# Patient Record
Sex: Female | Born: 1955
Health system: Southern US, Community
[De-identification: ages and names within clinical notes are randomized; demographics above are authoritative.]

## PROBLEM LIST (undated history)

## (undated) DIAGNOSIS — M797 Fibromyalgia: Secondary | ICD-10-CM

## (undated) DIAGNOSIS — E669 Obesity, unspecified: Secondary | ICD-10-CM

## (undated) DIAGNOSIS — I10 Essential (primary) hypertension: Secondary | ICD-10-CM

## (undated) DIAGNOSIS — F419 Anxiety disorder, unspecified: Secondary | ICD-10-CM

## (undated) DIAGNOSIS — G4730 Sleep apnea, unspecified: Secondary | ICD-10-CM

## (undated) DIAGNOSIS — E119 Type 2 diabetes mellitus without complications: Secondary | ICD-10-CM

## (undated) DIAGNOSIS — E785 Hyperlipidemia, unspecified: Secondary | ICD-10-CM

## (undated) DIAGNOSIS — F32A Depression, unspecified: Secondary | ICD-10-CM

## (undated) HISTORY — DX: Sleep apnea, unspecified: G47.30

## (undated) HISTORY — PX: OTHER SURGICAL HISTORY: SHX169

## (undated) HISTORY — DX: Type 2 diabetes mellitus without complications: E11.9

## (undated) HISTORY — DX: Hyperlipidemia, unspecified: E78.5

## (undated) HISTORY — DX: Obesity, unspecified: E66.9

## (undated) HISTORY — PX: HERNIA REPAIR: SHX51

## (undated) HISTORY — PX: CHOLECYSTECTOMY: SHX55

---
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
## 1999-04-29 ENCOUNTER — Other Ambulatory Visit: Admission: RE | Admit: 1999-04-29 | Discharge: 1999-04-29 | Payer: Self-pay | Admitting: *Deleted

## 1999-10-28 ENCOUNTER — Encounter: Admission: RE | Admit: 1999-10-28 | Discharge: 1999-10-28 | Payer: Self-pay | Admitting: Family Medicine

## 1999-10-28 ENCOUNTER — Encounter: Payer: Self-pay | Admitting: Family Medicine

## 2000-11-09 ENCOUNTER — Encounter: Admission: RE | Admit: 2000-11-09 | Discharge: 2000-11-09 | Payer: Self-pay | Admitting: Family Medicine

## 2000-11-09 ENCOUNTER — Encounter: Payer: Self-pay | Admitting: Family Medicine

## 2002-01-17 ENCOUNTER — Encounter: Payer: Self-pay | Admitting: Family Medicine

## 2002-01-17 ENCOUNTER — Encounter: Admission: RE | Admit: 2002-01-17 | Discharge: 2002-01-17 | Payer: Self-pay | Admitting: Family Medicine

## 2002-02-20 ENCOUNTER — Observation Stay (HOSPITAL_COMMUNITY): Admission: RE | Admit: 2002-02-20 | Discharge: 2002-02-21 | Payer: Self-pay | Admitting: Surgery

## 2002-02-20 ENCOUNTER — Encounter (INDEPENDENT_AMBULATORY_CARE_PROVIDER_SITE_OTHER): Payer: Self-pay | Admitting: Specialist

## 2002-06-06 ENCOUNTER — Other Ambulatory Visit: Admission: RE | Admit: 2002-06-06 | Discharge: 2002-06-06 | Payer: Self-pay | Admitting: Family Medicine

## 2003-02-13 ENCOUNTER — Encounter: Admission: RE | Admit: 2003-02-13 | Discharge: 2003-02-13 | Payer: Self-pay | Admitting: *Deleted

## 2003-02-13 ENCOUNTER — Encounter: Payer: Self-pay | Admitting: *Deleted

## 2003-06-26 ENCOUNTER — Other Ambulatory Visit: Admission: RE | Admit: 2003-06-26 | Discharge: 2003-06-26 | Payer: Self-pay | Admitting: *Deleted

## 2003-08-07 ENCOUNTER — Encounter: Admission: RE | Admit: 2003-08-07 | Discharge: 2003-08-07 | Payer: Self-pay | Admitting: *Deleted

## 2003-08-07 ENCOUNTER — Encounter: Payer: Self-pay | Admitting: *Deleted

## 2003-10-15 ENCOUNTER — Ambulatory Visit (HOSPITAL_BASED_OUTPATIENT_CLINIC_OR_DEPARTMENT_OTHER): Admission: RE | Admit: 2003-10-15 | Discharge: 2003-10-15 | Payer: Self-pay | Admitting: Surgery

## 2003-10-15 ENCOUNTER — Encounter (INDEPENDENT_AMBULATORY_CARE_PROVIDER_SITE_OTHER): Payer: Self-pay | Admitting: *Deleted

## 2003-10-15 ENCOUNTER — Ambulatory Visit (HOSPITAL_COMMUNITY): Admission: RE | Admit: 2003-10-15 | Discharge: 2003-10-15 | Payer: Self-pay | Admitting: Surgery

## 2004-02-12 ENCOUNTER — Encounter: Admission: RE | Admit: 2004-02-12 | Discharge: 2004-02-12 | Payer: Self-pay | Admitting: Family Medicine

## 2004-07-01 ENCOUNTER — Other Ambulatory Visit: Admission: RE | Admit: 2004-07-01 | Discharge: 2004-07-01 | Payer: Self-pay | Admitting: Family Medicine

## 2005-02-17 ENCOUNTER — Encounter: Admission: RE | Admit: 2005-02-17 | Discharge: 2005-02-17 | Payer: Self-pay | Admitting: Family Medicine

## 2005-10-23 ENCOUNTER — Other Ambulatory Visit: Admission: RE | Admit: 2005-10-23 | Discharge: 2005-10-23 | Payer: Self-pay | Admitting: Family Medicine

## 2006-03-23 ENCOUNTER — Encounter: Admission: RE | Admit: 2006-03-23 | Discharge: 2006-03-23 | Payer: Self-pay | Admitting: Family Medicine

## 2006-06-14 ENCOUNTER — Encounter: Admission: RE | Admit: 2006-06-14 | Discharge: 2006-06-14 | Payer: Self-pay | Admitting: Family Medicine

## 2006-10-03 ENCOUNTER — Emergency Department (HOSPITAL_COMMUNITY): Admission: EM | Admit: 2006-10-03 | Discharge: 2006-10-03 | Payer: Self-pay | Admitting: Emergency Medicine

## 2006-12-13 ENCOUNTER — Other Ambulatory Visit: Admission: RE | Admit: 2006-12-13 | Discharge: 2006-12-13 | Payer: Self-pay | Admitting: Family Medicine

## 2007-01-11 ENCOUNTER — Ambulatory Visit: Payer: Self-pay | Admitting: Gastroenterology

## 2007-01-25 ENCOUNTER — Encounter (INDEPENDENT_AMBULATORY_CARE_PROVIDER_SITE_OTHER): Payer: Self-pay | Admitting: Specialist

## 2007-01-25 ENCOUNTER — Ambulatory Visit: Payer: Self-pay | Admitting: Gastroenterology

## 2007-04-05 ENCOUNTER — Encounter: Admission: RE | Admit: 2007-04-05 | Discharge: 2007-04-05 | Payer: Self-pay | Admitting: Family Medicine

## 2007-12-05 ENCOUNTER — Emergency Department: Payer: Self-pay | Admitting: Emergency Medicine

## 2008-02-07 ENCOUNTER — Other Ambulatory Visit: Admission: RE | Admit: 2008-02-07 | Discharge: 2008-02-07 | Payer: Self-pay | Admitting: Family Medicine

## 2008-09-17 ENCOUNTER — Encounter: Admission: RE | Admit: 2008-09-17 | Discharge: 2008-09-17 | Payer: Self-pay | Admitting: Internal Medicine

## 2008-10-02 ENCOUNTER — Encounter: Admission: RE | Admit: 2008-10-02 | Discharge: 2008-10-02 | Payer: Self-pay | Admitting: Family Medicine

## 2009-02-05 ENCOUNTER — Other Ambulatory Visit: Admission: RE | Admit: 2009-02-05 | Discharge: 2009-02-05 | Payer: Self-pay | Admitting: Family Medicine

## 2009-05-06 ENCOUNTER — Encounter: Admission: RE | Admit: 2009-05-06 | Discharge: 2009-05-06 | Payer: Self-pay | Admitting: Internal Medicine

## 2009-10-08 ENCOUNTER — Encounter: Admission: RE | Admit: 2009-10-08 | Discharge: 2009-10-08 | Payer: Self-pay | Admitting: Family Medicine

## 2010-10-10 ENCOUNTER — Encounter: Admission: RE | Admit: 2010-10-10 | Discharge: 2010-10-10 | Payer: Self-pay | Admitting: Family Medicine

## 2011-04-28 NOTE — Op Note (Signed)
   Bonnie Ramos, Bonnie Ramos                          ACCOUNT NO.:  [id number]   MEDICAL RECORD NO.:  [id number]                   PATIENT TYPE:  AMB   LOCATION:  DSC                                  FACILITY:  [facility]   PHYSICIAN:  Currie Paris, M.D.           DATE OF BIRTH:  [birth year]   DATE OF PROCEDURE:  10/15/2003  DATE OF DISCHARGE:                                 OPERATIVE REPORT   PREOPERATIVE DIAGNOSIS:  Chronically inflamed epidermoid cyst, left axilla.   POSTOPERATIVE DIAGNOSIS:  Chronically inflamed epidermoid cyst, left axilla.   PROCEDURE:  Excision of epidermoid cyst, left axilla.   SURGEON:  Currie Paris, M.D.   ANESTHESIA:  Local.   CLINICAL HISTORY:  This patient for many years has had an epidermoid cyst  which intermittently reforms and drains. It was currently quiescent and we  scheduled her for an excision.   DESCRIPTION OF PROCEDURE:  The patient was seen in the minor procedure area  and had no further questions.  The area was marked and identified by both  myself and the patient.  The skin and the area around it were anesthetized  with about 8 mL of 1% Xylocaine with epinephrine plus 1 mL of sodium  bicarbonate mixed in.  This gave good anesthesia.  The area was prepped with  some Betadine.  An elliptical incision was made to include the punctum and  the small subcutaneous mass that was seen to be attached to the dermis.  The  incision was then closed with some 4-0 Monocryl subcuticular and Dermabond.  The patient tolerated the procedure well with no problems.                                               Currie Paris, M.D.    CJS/MEDQ  D:  10/15/2003  T:  10/15/2003  Job:  [id number]

## 2011-04-28 NOTE — Op Note (Signed)
[facility]  Patient:    Bonnie Ramos, Bonnie Ramos Visit Number: [id number] MRN: [id number]          Service Type: SUR Location: 3W 0366 01 Attending Physician:  Charlton Haws Dictated by:   Currie Paris, M.D. Proc. Date: 02/20/02 Admit Date:  02/20/2002   CC:         Meredith Staggers, M.D.  Charolett Bumpers III, M.D.   Operative Report  VISIT NUMBER:  [id number].  OFFICE MEDICAL RECORD NUMBER:  [id number].  PREOPERATIVE DIAGNOSIS:  Chronic calculus cholecystitis.  POSTOPERATIVE DIAGNOSIS:  Chronic calculus cholecystitis.  OPERATION: Laparoscopic cholecystectomy.  SURGEON:  Currie Paris, M.D.  ASSISTANT:  Adolph Pollack, M.D.  ANESTHESIA:  General endotracheal.  CLINICAL HISTORY:  This patient is a 55 year old woman with biliary-type symptoms and multiple stones seen on gallbladder ultrasound. After discussion of the procedure, she elected to proceed to laparoscopic cholecystectomy.  DESCRIPTION OF PROCEDURE:  The patient was brought to the operating room and after satisfactory general endotracheal anesthesia had been attained, the abdomen was prepped and draped. I used a vertical incision in the umbilicus because she had had a previous transverse on the right there and was able to identify the fascia, open it, and enter the peritoneal cavity under direct vision. Pursestring was placed and the Hasson introduced. The abdomen was insufflated to 15 and the camera introduced.  The gallbladder was thin walled but very chronically inflamed with instead of a robins egg blue more of a whitish discoloration to it. No other gross abnormalities were seen in the peritoneal cavity. There are some omental adhesions inferiorly and around the umbilicus.  The patient was placed in reverse Trendelenburg and tilted to the left. A 10 mm trocar was placed in the epigastric and two 5 mm laterally all under direct vision. The gallbladder  is retracted and dissection begun around the cystic duct and I could identify the cystic duct and the cystic artery and freed up the peritoneum on either side of the gallbladder so that I had a large window in the triangle of Calot, could see this area clearly. Once this was all identified, I put three clips on the stay side of the cystic duct and one on the gallbladder side and divided it. The cystic artery was divided in a similar fashion with two clips on the stay side and then a small _______ clipped one time; this was just adjacent to the artery.  The gallbladder was then removed from below to above with coagulation current _______ cautery. Just prior to disconnecting it from the gallbladder bed, we irrigated to make sure everything was dry and then disconnected it and brought it out through the umbilical port. The abdomen was reinsufflated and a final irrigation taken and everything again appeared dry. The lateral ports were removed and they did not bleed. The umbilical port was closed through the previously placed pursestring. The abdomen was deflated through the epigastric port. The skin was closed with 2-0 Monocryl subcuticular and Steri-Strips. the patient tolerated the procedure well and there no operative complications and all counts were correct Dictated by:   Currie Paris, M.D. Attending Physician:  Charlton Haws DD:  02/20/02 TD:  02/20/02 Job: [id number]

## 2011-06-23 ENCOUNTER — Ambulatory Visit: Payer: 59 | Attending: Family Medicine | Admitting: *Deleted

## 2011-06-23 DIAGNOSIS — IMO0001 Reserved for inherently not codable concepts without codable children: Secondary | ICD-10-CM | POA: Insufficient documentation

## 2011-06-23 DIAGNOSIS — R42 Dizziness and giddiness: Secondary | ICD-10-CM | POA: Insufficient documentation

## 2011-07-14 ENCOUNTER — Encounter: Payer: 59 | Admitting: Physical Therapy

## 2011-07-28 ENCOUNTER — Ambulatory Visit: Payer: 59 | Admitting: Physical Therapy

## 2011-09-04 ENCOUNTER — Other Ambulatory Visit: Payer: Self-pay | Admitting: Family Medicine

## 2011-09-04 DIAGNOSIS — Z1231 Encounter for screening mammogram for malignant neoplasm of breast: Secondary | ICD-10-CM

## 2011-10-16 ENCOUNTER — Ambulatory Visit
Admission: RE | Admit: 2011-10-16 | Discharge: 2011-10-16 | Disposition: A | Discharge status: Home / Self Care | Payer: 59 | Source: Ambulatory Visit | Attending: Family Medicine | Admitting: Family Medicine

## 2011-10-16 DIAGNOSIS — Z1231 Encounter for screening mammogram for malignant neoplasm of breast: Secondary | ICD-10-CM

## 2012-09-18 ENCOUNTER — Other Ambulatory Visit: Payer: Self-pay | Admitting: Family Medicine

## 2012-09-18 DIAGNOSIS — Z1231 Encounter for screening mammogram for malignant neoplasm of breast: Secondary | ICD-10-CM

## 2012-10-18 ENCOUNTER — Ambulatory Visit: Payer: 59

## 2013-01-10 ENCOUNTER — Other Ambulatory Visit: Payer: Self-pay | Admitting: Family Medicine

## 2013-01-10 ENCOUNTER — Other Ambulatory Visit (HOSPITAL_COMMUNITY)
Admission: RE | Admit: 2013-01-10 | Discharge: 2013-01-10 | Disposition: A | Discharge status: Home / Self Care | Payer: BC Managed Care – PPO | Source: Ambulatory Visit | Attending: Family Medicine | Admitting: Family Medicine

## 2013-01-10 DIAGNOSIS — Z0000 Encounter for general adult medical examination without abnormal findings: Secondary | ICD-10-CM | POA: Insufficient documentation

## 2013-01-17 ENCOUNTER — Ambulatory Visit
Admission: RE | Admit: 2013-01-17 | Discharge: 2013-01-17 | Disposition: A | Discharge status: Home / Self Care | Payer: BC Managed Care – PPO | Source: Ambulatory Visit | Attending: Family Medicine | Admitting: Family Medicine

## 2013-01-17 DIAGNOSIS — Z1231 Encounter for screening mammogram for malignant neoplasm of breast: Secondary | ICD-10-CM

## 2013-12-18 ENCOUNTER — Other Ambulatory Visit: Payer: Self-pay

## 2013-12-18 DIAGNOSIS — Z1231 Encounter for screening mammogram for malignant neoplasm of breast: Secondary | ICD-10-CM

## 2014-01-19 ENCOUNTER — Ambulatory Visit
Admission: RE | Admit: 2014-01-19 | Discharge: 2014-01-19 | Disposition: A | Discharge status: Home / Self Care | Payer: BC Managed Care – PPO | Source: Ambulatory Visit

## 2014-01-19 DIAGNOSIS — Z1231 Encounter for screening mammogram for malignant neoplasm of breast: Secondary | ICD-10-CM

## 2014-11-20 ENCOUNTER — Encounter: Payer: Self-pay | Admitting: *Deleted

## 2014-11-20 ENCOUNTER — Encounter: Payer: BC Managed Care – PPO | Attending: Family Medicine | Admitting: *Deleted

## 2014-11-20 VITALS — Ht 64.0 in | Wt 254.6 lb

## 2014-11-20 DIAGNOSIS — E118 Type 2 diabetes mellitus with unspecified complications: Secondary | ICD-10-CM | POA: Insufficient documentation

## 2014-11-20 DIAGNOSIS — E1165 Type 2 diabetes mellitus with hyperglycemia: Secondary | ICD-10-CM | POA: Insufficient documentation

## 2014-11-20 DIAGNOSIS — IMO0002 Reserved for concepts with insufficient information to code with codable children: Secondary | ICD-10-CM

## 2014-11-20 DIAGNOSIS — Z713 Dietary counseling and surveillance: Secondary | ICD-10-CM | POA: Insufficient documentation

## 2014-11-20 NOTE — Progress Notes (Signed)
Diabetes Self-Management Education  Visit Type:  Initial  Appt. Start Time: 0900 Appt. End Time: 1030  11/20/2014  [name], identified by name and date of birth, is a 58 y.o. female with a diagnosis of Diabetes: Type 2.  Other people present during visit:  Patient   ASSESSMENT  Height 5\' 4"  (1.626 m), weight 254 lb 9.6 oz (115.486 kg). Body mass index is 43.68 kg/(m^2).  Initial Visit Information:  Are you currently following a meal plan?: No (has been avoiding carbs)   Are you taking your medications as prescribed?: Yes Are you checking your feet?: No   How often do you need to have someone help you when you read instructions, pamphlets, or other written materials from your doctor or pharmacy?: 1 - Never What is the last grade level you completed in school?: college  Psychosocial:   Patient Belief/Attitude about Diabetes: Motivated to manage diabetes (afraid of outcomes if complications) Self-care barriers: None Self-management support: Doctor's office Other persons present: Patient Patient Concerns: Nutrition/Meal planning, Monitoring, Weight Control Special Needs: None Preferred Learning Style: Visual Learning Readiness: Change in progress  Complications:   Last HgB A1C per patient/outside source: 7.6% How often do you check your blood sugar?: 1-2 times/day Fasting Blood glucose range (mg/dL): 191-478130-179 Postprandial Blood glucose range (mg/dL): 29-56270-129 Number of hypoglycemic episodes per month: 0 Have you had a dilated eye exam in the past 12 months?: Yes Have you had a dental exam in the past 12 months?: No  Diet Intake:  Breakfast: 30 gram protein shake with 4-5 grams carb, coffee black Snack (morning): no Lunch: goes out for lunch- salad with meat, (no fast food), diet soda Snack (afternoon): no unless nuts or cheese Dinner: meat and vegetable, avoiding starch now Snack (evening): home made sugar free Margarita with low calorie lemonade, sugar free  syrup Beverage(s): black coffee, diet soda,   Exercise:  Exercise: Light (walking / raking leaves) Light Exercise amount of time (min / week): 150  Individualized Plan for Diabetes Self-Management Training:   Learning Objective:  Patient will have a greater understanding of diabetes self-management.  Patient education plan per assessed needs and concerns is to attend individual sessions for     Education Topics Reviewed with Patient Today:  Definition of diabetes, type 1 and 2, and the diagnosis of diabetes Role of diet in the treatment of diabetes and the relationship between the three main macronutrients and blood glucose level, Reviewed blood glucose goals for pre and post meals and how to evaluate the patients' food intake on their blood glucose level., Food label reading, portion sizes and measuring food. Role of exercise on diabetes management, blood pressure control and cardiac health., Helped patient identify appropriate exercises in relation to his/her diabetes, diabetes complications and other health issue. Reviewed patients medication for diabetes, action, purpose, timing of dose and side effects. Identified appropriate SMBG and/or A1C goals., Daily foot exams     Role of stress on diabetes      PATIENTS GOALS/Plan (Developed by the patient):  Nutrition: Follow meal plan discussed Physical Activity: 30 minutes per day Medications: take my medication as prescribed Monitoring : test blood glucose pre and post meals as discussed  Plan:   Patient Instructions  Plan:  Aim for 2 Carb Choices per meal (30 grams) +/- 1 either way  Aim for 0-1 Carbs per snack if hungry  Include protein in moderation with your meals and snacks Consider reading food labels for Total Carbohydrate of foods Continue  with your activity level daily as tolerated Consider checking BG at alternate times per day  Consider taking medication (Metformin) after your meal to help with GI  upset      Expected Outcomes:  Demonstrated interest in learning. Expect positive outcomes  Education material provided: Living Well with Diabetes, Food label handouts, A1C conversion sheet, Meal plan card and Carbohydrate counting sheet  If problems or questions, patient to contact team via:  Phone and Email  Future DSME appointment: 3-4 months

## 2014-11-20 NOTE — Patient Instructions (Signed)
Plan:  Aim for 2 Carb Choices per meal (30 grams) +/- 1 either way  Aim for 0-1 Carbs per snack if hungry  Include protein in moderation with your meals and snacks Consider reading food labels for Total Carbohydrate of foods Continue with your activity level daily as tolerated Consider checking BG at alternate times per day  Consider taking medication (Metformin) after your meal to help with GI upset

## 2014-12-25 ENCOUNTER — Other Ambulatory Visit: Payer: Self-pay

## 2014-12-25 DIAGNOSIS — Z1231 Encounter for screening mammogram for malignant neoplasm of breast: Secondary | ICD-10-CM

## 2015-01-22 ENCOUNTER — Ambulatory Visit: Payer: BC Managed Care – PPO | Admitting: *Deleted

## 2015-01-25 ENCOUNTER — Ambulatory Visit: Payer: Self-pay

## 2015-01-29 ENCOUNTER — Encounter (INDEPENDENT_AMBULATORY_CARE_PROVIDER_SITE_OTHER): Payer: Self-pay

## 2015-01-29 ENCOUNTER — Ambulatory Visit
Admission: RE | Admit: 2015-01-29 | Discharge: 2015-01-29 | Disposition: A | Discharge status: Home / Self Care | Payer: BLUE CROSS/BLUE SHIELD | Source: Ambulatory Visit

## 2015-01-29 DIAGNOSIS — Z1231 Encounter for screening mammogram for malignant neoplasm of breast: Secondary | ICD-10-CM

## 2015-03-10 ENCOUNTER — Encounter (HOSPITAL_COMMUNITY): Payer: Self-pay | Admitting: *Deleted

## 2015-03-10 ENCOUNTER — Emergency Department (INDEPENDENT_AMBULATORY_CARE_PROVIDER_SITE_OTHER)
Admission: EM | Admit: 2015-03-10 | Discharge: 2015-03-10 | Disposition: A | Discharge status: Home / Self Care | Payer: Worker's Compensation | Source: Home / Self Care | Attending: Family Medicine | Admitting: Family Medicine

## 2015-03-10 DIAGNOSIS — Z7721 Contact with and (suspected) exposure to potentially hazardous body fluids: Secondary | ICD-10-CM

## 2015-03-10 LAB — CBC WITH DIFFERENTIAL/PLATELET
Basophils Absolute: 0 10*3/uL (ref 0.0–0.1)
Basophils Relative: 0 % (ref 0–1)
Eosinophils Absolute: 0.3 10*3/uL (ref 0.0–0.7)
Eosinophils Relative: 3 % (ref 0–5)
HCT: 44.6 % (ref 36.0–46.0)
Hemoglobin: 14.6 g/dL (ref 12.0–15.0)
Lymphocytes Relative: 28 % (ref 12–46)
Lymphs Abs: 2.3 10*3/uL (ref 0.7–4.0)
MCH: 30.7 pg (ref 26.0–34.0)
MCHC: 32.7 g/dL (ref 30.0–36.0)
MCV: 93.9 fL (ref 78.0–100.0)
Monocytes Absolute: 0.5 10*3/uL (ref 0.1–1.0)
Monocytes Relative: 6 % (ref 3–12)
Neutro Abs: 5.1 10*3/uL (ref 1.7–7.7)
Neutrophils Relative %: 63 % (ref 43–77)
Platelets: 238 10*3/uL (ref 150–400)
RBC: 4.75 MIL/uL (ref 3.87–5.11)
RDW: 12.9 % (ref 11.5–15.5)
WBC: 8.2 10*3/uL (ref 4.0–10.5)

## 2015-03-10 LAB — COMPREHENSIVE METABOLIC PANEL
ALT: 24 U/L (ref 0–35)
AST: 21 U/L (ref 0–37)
Albumin: 4.3 g/dL (ref 3.5–5.2)
Alkaline Phosphatase: 101 U/L (ref 39–117)
Anion gap: 11 (ref 5–15)
BUN: 14 mg/dL (ref 6–23)
CO2: 28 mmol/L (ref 19–32)
Calcium: 9.3 mg/dL (ref 8.4–10.5)
Chloride: 101 mmol/L (ref 96–112)
Creatinine, Ser: 0.83 mg/dL (ref 0.50–1.10)
GFR calc Af Amer: 88 mL/min — ABNORMAL LOW (ref >90)
GFR calc non Af Amer: 76 mL/min — ABNORMAL LOW (ref >90)
Glucose, Bld: 118 mg/dL — ABNORMAL HIGH (ref 70–99)
Potassium: 3.7 mmol/L (ref 3.5–5.1)
Sodium: 140 mmol/L (ref 135–145)
Total Bilirubin: 0.8 mg/dL (ref 0.3–1.2)
Total Protein: 7.6 g/dL (ref 6.0–8.3)

## 2015-03-10 LAB — HEPATITIS PANEL, ACUTE
HCV Ab: NEGATIVE
Hep A IgM: NONREACTIVE
Hep B C IgM: NONREACTIVE
Hepatitis B Surface Ag: NEGATIVE

## 2015-03-10 MED ORDER — HEPATITIS B IMMUNE GLOBULIN IM SOLN: 7.0000 mL | Freq: Once | INTRAMUSCULAR | Status: DC

## 2015-03-10 NOTE — ED Notes (Signed)
Clydie BraunKaren from the pharmacy called and said that they were going to have to [facility] the [facility] and ETA is 2200.  Discussed with Dr. Artis FlockKindl and pt. It was decided that pt. would come back tomorrow at 1300 on her lunch break to get the injection.  Pharmacist notified.

## 2015-03-10 NOTE — ED Provider Notes (Signed)
CSN: [id number]     Arrival date & time 03/10/15  1742 History   First MD Initiated Contact with Patient 03/10/15 1839     No chief complaint on file.  (Consider location/radiation/quality/duration/timing/severity/associated sxs/prior Treatment) Patient is a 59 y.o. female presenting with general illness. The history is provided by the patient.  Illness Location:  Pt was stuck with probe while cleaning teeth of pt with known hep b, went thru glove, no blood from scratch, washed finger thereafter, here for eval. Severity:  Mild Progression:  Unchanged Chronicity:  New Risk factors:  Known hep b nonresponder from 3 hepb vacc series.   Past Medical History  Diagnosis Date  . Diabetes mellitus without complication   . Hyperlipidemia   . Sleep apnea   . Obesity    Past Surgical History  Procedure Laterality Date  . Cesarean section    . Cholecystectomy    . Tonsilectomy/adenoidectomy with myringotomy     Family History  Problem Relation Age of Onset  . Adopted: Yes  . Family history unknown: Yes   History  Substance Use Topics  . Smoking status: Not on file  . Smokeless tobacco: Not on file  . Alcohol Use: Not on file   OB History    No data available     Review of Systems  Skin: Negative.  Negative for wound.    Allergies  Erythromycin  Home Medications   Prior to Admission medications   Medication Sig Start Date End Date Taking? Authorizing Provider  aspirin 81 MG tablet Take 81 mg by mouth daily.    Historical Provider, MD  buPROPion (WELLBUTRIN XL) 300 MG 24 hr tablet Take 300 mg by mouth daily.    Historical Provider, MD  DULoxetine (CYMBALTA) 30 MG capsule Take 30 mg by mouth daily.    Historical Provider, MD  loratadine (CLARITIN) 10 MG tablet Take 10 mg by mouth daily.    Historical Provider, MD  metFORMIN (GLUCOPHAGE) 500 MG tablet Take 500 mg by mouth daily with breakfast.    Historical Provider, MD  pregabalin (LYRICA) 150 MG capsule Take 150 mg by  mouth 2 (two) times daily.    Historical Provider, MD  Red Yeast Rice Extract (RED YEAST RICE PO) Take by mouth.    Historical Provider, MD   BP 158/80 mmHg  Pulse 70  Temp(Src) 98 F (36.7 C) (Oral)  Resp 16  Wt 250 lb (113.399 kg)  SpO2 100% Physical Exam  Constitutional: She is oriented to person, place, and time. She appears well-developed and well-nourished.  Neurological: She is alert and oriented to person, place, and time.  Skin: Skin is warm and dry.  Nursing note and vitals reviewed.   ED Course  Procedures (including critical care time) Labs Review Labs Reviewed  CBC WITH DIFFERENTIAL/PLATELET  COMPREHENSIVE METABOLIC PANEL  HEPATITIS PANEL, ACUTE  HIV ANTIBODY (ROUTINE TESTING)    Imaging Review No results found.   MDM   1. Exposure to potentially hazardous body fluids    Discussed with dr Jonny Ruizjohn campbell id --rec basic labs and hbig vacc 2shot series, pt agrees.    Linna HoffJames D Emoni Yang, MD 03/10/15 [phone]

## 2015-03-10 NOTE — ED Notes (Addendum)
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  Pt. has had 3 rounds of Hepatitis B vaccine and is a non-responder to it.

## 2015-03-10 NOTE — ED Notes (Signed)
Pharmacy only has 6 ml. Dr. Artis FlockKindl notified. The pharmacy will try to get the rest of the dose from another facility.

## 2015-03-10 NOTE — ED Notes (Signed)
Sent here by [facility], as they were no longer accepting patients for the day. 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 Instrument not hollow. Source patient will be coming in for labs directly. Source patient is hepatitis B positive. Injured patient has completed the hepatitis series x 3 and has been identified as a "serum non-responder" for the hepatitis B series

## 2015-03-10 NOTE — Discharge Instructions (Signed)
[facility] month for 2nd hbig vaccination and discussion about further lab eval. Return on thurs for 1st vaccination dose

## 2015-03-10 NOTE — ED Notes (Signed)
Pharmacy called and order for Hepatitis B immune globulin ordered. Pt.'s wt 250 lbs. given.

## 2015-03-11 ENCOUNTER — Encounter (HOSPITAL_COMMUNITY): Payer: Self-pay | Admitting: Emergency Medicine

## 2015-03-11 ENCOUNTER — Emergency Department (INDEPENDENT_AMBULATORY_CARE_PROVIDER_SITE_OTHER)
Admission: EM | Admit: 2015-03-11 | Discharge: 2015-03-11 | Disposition: A | Discharge status: Home / Self Care | Payer: Worker's Compensation | Source: Home / Self Care | Attending: Family Medicine | Admitting: Family Medicine

## 2015-03-11 DIAGNOSIS — Z205 Contact with and (suspected) exposure to viral hepatitis: Secondary | ICD-10-CM

## 2015-03-11 LAB — HIV ANTIBODY (ROUTINE TESTING W REFLEX): HIV Screen 4th Generation wRfx: NONREACTIVE

## 2015-03-11 MED ORDER — HEPATITIS B IMMUNE GLOBULIN IM SOLN
7.0000 mL | Freq: Once | INTRAMUSCULAR | Status: AC
  Administered 2015-03-11: 7 mL via INTRAMUSCULAR

## 2015-03-11 NOTE — ED Notes (Signed)
3 ml left glut.  2 ml left glut.  Right glut 1 ml  Injection of hep B immune Globulin.   EXP jan 2017 WUJ#[id number]#[id number] a (1 ml dose)    Lot# [id number] EXP Dec 2016  (5 ml dose).   Doses and administration verified by pharmacist and provider Hayden Rasmussenavid Mabe, NP.     Mw, cma

## 2015-03-11 NOTE — ED Notes (Signed)
[id number]  [id number] Given in left deltoid, 0.685ml Hepatitis B Immune Globulin HyperHep B  [id number] Given in right deltoid 0.315ml Hepatitis B Immune Globulin HyperHep B

## 2015-03-11 NOTE — ED Provider Notes (Signed)
CSN: [id number]     Arrival date & time 03/11/15  1229 History   None    Chief Complaint  Patient presents with  . Follow-up    pt here for injection.     (Consider location/radiation/quality/duration/timing/severity/associated sxs/prior Treatment) HPI Comments: Pt here for dose of HEP B immunoglobulin post exposure to needle stick from known infected/carrier of Hep B.   Past Medical History  Diagnosis Date  . Diabetes mellitus without complication   . Hyperlipidemia   . Sleep apnea   . Obesity    Past Surgical History  Procedure Laterality Date  . Cesarean section    . Cholecystectomy    . Tonsilectomy/adenoidectomy with myringotomy  1962    without myringotomy per pt. Entered incorrectly.   Family History  Problem Relation Age of Onset  . Adopted: Yes  . Family history unknown: Yes   History  Substance Use Topics  . Smoking status: Never Smoker   . Smokeless tobacco: Not on file  . Alcohol Use: Yes     Comment: occasional   OB History    No data available     Review of Systems  All other systems reviewed and are negative.   Allergies  Erythromycin  Home Medications   Prior to Admission medications   Medication Sig Start Date End Date Taking? Authorizing Provider  aspirin 81 MG tablet Take 81 mg by mouth daily.    Historical Provider, MD  buPROPion (WELLBUTRIN XL) 300 MG 24 hr tablet Take 300 mg by mouth daily.    Historical Provider, MD  DULoxetine (CYMBALTA) 30 MG capsule Take 30 mg by mouth daily.    Historical Provider, MD  loratadine (CLARITIN) 10 MG tablet Take 10 mg by mouth daily.    Historical Provider, MD  metFORMIN (GLUCOPHAGE) 500 MG tablet Take 500 mg by mouth daily with supper.     Historical Provider, MD  pregabalin (LYRICA) 150 MG capsule Take 150 mg by mouth 2 (two) times daily.    Historical Provider, MD  Red Yeast Rice Extract (RED YEAST RICE PO) Take 1,200 mg by mouth 2 (two) times daily.     Historical Provider, MD   BP 147/72 mmHg   Pulse 77  Temp(Src) 98.2 F (36.8 C) (Oral)  Resp 16  SpO2 99% Physical Exam  Constitutional: She is oriented to person, place, and time. She appears well-developed and well-nourished. No distress.  Neurological: She is alert and oriented to person, place, and time. She exhibits normal muscle tone.  Nursing note and vitals reviewed.   ED Course  Procedures (including critical care time) Labs Review Labs Reviewed - No data to display  Imaging Review No results found.   MDM   1. Exposure to hepatitis B    Hep Immunoglobulin 7 ml IM PHarmacy consult.  Return 1 month    Hayden Rasmussenavid Matheu Ploeger, NP 03/11/15 1330

## 2015-03-11 NOTE — Discharge Instructions (Signed)
Needle Stick Injury  A needle stick injury occurs when you are stuck by a needle that may have the blood from another person on it. Most of the time these injuries heal without any problem, but several diseases can be transmitted this way. You should be aware of the risks. A needle stick injury can cause risk for getting:  · Hepatitis B.  · Hepatitis C.  · HIV infection (the virus that causes AIDS).  The chance of getting one of these infections from a needle stick injury is small. However, it is important to take proper precautions to prevent such an injury. It is also important to understand and follow some health care recommendations when such an injury occurs.   RISK FACTORS  In general, the risk of infection after a needle stick injury appears to be higher with:  · Exposure to a needle that is visibly contaminated with blood.  · Exposure to the blood of a patient with an advanced disease or with a high viral load.  · A deep injury.  · Needle placement in a vein or artery.  PREVENTION   All health care workers should:  · Wash their hands often, including before and after caring for each patient.  · Receive the hepatitis B vaccine before any possible exposure to blood or bloody body fluids.  · Use personal protective equipment (PPE) when appropriate. This includes:  ¨ Gloves.  ¨ Gowns.  ¨ Boots.  ¨ Shoe covers.  ¨ Eyewear.  ¨ Masks.  · Wear gloves when any kind of venous or arterial access is being done.  · Use safety devices when available.  · Use sharp edges and needles with caution.  · Dispose of used needles and other sharps in puncture proof receptacles.  · Never recap needles.  TREATMENT   · After a needle stick injury, immediate cleansing with soap and water or an alcohol-based hand hygiene agent is needed.  · If you did not have a tetanus booster within the past 10 years, a booster shot should be given.  · If the puncture site becomes red, swollen, more painful, or drains yellowish-white fluid (pus),  medicine for a bacterial infection may be needed.  · If the blood on the needle is known or thought to be high risk for hepatitis B or HIV, additional treatment is needed.  · For needle stick exposures to the HIV virus, drug treatment is advised. This treatment is called post-exposure prophylaxis (PEP) and should be started as soon as possible following the injury. The recommended period of treatment with medicines is usually 4 weeks with 2 or more different drugs. You should have follow-up counseling and a medical evaluation, including HIV blood tests, right away. The tests should be repeated at 6 weeks, 3 months, and 6 months. Blood tests to monitor for drug toxicity effects of the PEP medicines are usually recommended immediately before treatment starts and again at 2 weeks and 4 weeks after the start of PEP. Additional recommendations during the first 6 to 12 weeks after exposure include:  ¨ Practicing sexual abstinence or using condoms to prevent sexual transmission and to avoid pregnancy.  ¨ Refraining from donating blood, plasma, semen, organs, or other tissue.  ¨ For breastfeeding women, considering temporary discontinuation of breastfeeding while on PEP.  · For needle stick exposures to hepatitis B, blood testing and PEP is also needed. If you have not been vaccinated against hepatitis B, this vaccine series should be started and hepatitis B immune   globulin should also be given. If you have been previously vaccinated, your status of immunity to infection with hepatitis B can be tested by a blood antibody test. Before or after those test results are available, repeat vaccination with or without hepatitis B immune globulin will be considered.  · Unfortunately, no helpful treatment following hepatitis C exposure has been identified or is recommended. Follow-up blood testing is advised over a period of 4 weeks to 6 months to determine if the needle stick led to an infection. Ask your caregiver for advice about  this follow-up testing.  HOME CARE INSTRUCTIONS  · Take medicine exactly as told by your caregiver.  · Keep all follow-up appointments.  · Do not share personal hygiene items.  SEEK IMMEDIATE MEDICAL CARE IF:  · You have concerns about your injury, treatment, or follow-up.  · The injury site becomes red, swollen, or painful.  · The injury site drains pus.  MAKE SURE YOU:  · Understand these instructions.  · Will watch your condition.  · Will get help right away if you are not doing well or get worse.  Document Released: 11/27/2005 Document Revised: 04/13/2014 Document Reviewed: 05/02/2011  ExitCare® Patient Information ©2015 [employer]. This information is not intended to replace advice given to you by your health care provider. Make sure you discuss any questions you have with your health care provider.

## 2015-03-11 NOTE — ED Notes (Signed)
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  Post injection patient did feel "hot" clammy and gave patient ice/coke and provided cool, damp cloth.  Patient appreciative.  Reports feeling better.

## 2015-03-11 NOTE — ED Notes (Signed)
Pt was seen last night.  Meds received at [facility] for hep b.   Voices no other concerns at this time.

## 2016-01-03 ENCOUNTER — Other Ambulatory Visit: Payer: Self-pay

## 2016-01-03 DIAGNOSIS — Z1231 Encounter for screening mammogram for malignant neoplasm of breast: Secondary | ICD-10-CM

## 2016-01-06 ENCOUNTER — Other Ambulatory Visit: Payer: Self-pay | Admitting: Family Medicine

## 2016-01-06 DIAGNOSIS — E2839 Other primary ovarian failure: Secondary | ICD-10-CM

## 2016-02-02 ENCOUNTER — Ambulatory Visit: Payer: Self-pay

## 2016-02-02 ENCOUNTER — Other Ambulatory Visit: Payer: Self-pay

## 2016-02-04 ENCOUNTER — Other Ambulatory Visit: Payer: Self-pay

## 2016-02-04 ENCOUNTER — Ambulatory Visit: Payer: Self-pay

## 2016-03-10 ENCOUNTER — Ambulatory Visit
Admission: RE | Admit: 2016-03-10 | Discharge: 2016-03-10 | Disposition: A | Discharge status: Home / Self Care | Payer: BLUE CROSS/BLUE SHIELD | Source: Ambulatory Visit

## 2016-03-10 ENCOUNTER — Ambulatory Visit
Admission: RE | Admit: 2016-03-10 | Discharge: 2016-03-10 | Disposition: A | Discharge status: Home / Self Care | Payer: BLUE CROSS/BLUE SHIELD | Source: Ambulatory Visit | Attending: Family Medicine | Admitting: Family Medicine

## 2016-03-10 DIAGNOSIS — E2839 Other primary ovarian failure: Secondary | ICD-10-CM

## 2016-03-10 DIAGNOSIS — Z1231 Encounter for screening mammogram for malignant neoplasm of breast: Secondary | ICD-10-CM

## 2016-03-31 DIAGNOSIS — E119 Type 2 diabetes mellitus without complications: Secondary | ICD-10-CM | POA: Diagnosis not present

## 2016-05-12 DIAGNOSIS — F329 Major depressive disorder, single episode, unspecified: Secondary | ICD-10-CM | POA: Diagnosis not present

## 2016-05-12 DIAGNOSIS — E7800 Pure hypercholesterolemia, unspecified: Secondary | ICD-10-CM | POA: Diagnosis not present

## 2016-05-12 DIAGNOSIS — E119 Type 2 diabetes mellitus without complications: Secondary | ICD-10-CM | POA: Diagnosis not present

## 2016-05-12 DIAGNOSIS — Z7984 Long term (current) use of oral hypoglycemic drugs: Secondary | ICD-10-CM | POA: Diagnosis not present

## 2016-05-12 DIAGNOSIS — M797 Fibromyalgia: Secondary | ICD-10-CM | POA: Diagnosis not present

## 2016-08-25 DIAGNOSIS — E119 Type 2 diabetes mellitus without complications: Secondary | ICD-10-CM | POA: Diagnosis not present

## 2016-08-25 DIAGNOSIS — F324 Major depressive disorder, single episode, in partial remission: Secondary | ICD-10-CM | POA: Diagnosis not present

## 2016-08-25 DIAGNOSIS — Z23 Encounter for immunization: Secondary | ICD-10-CM | POA: Diagnosis not present

## 2016-08-25 DIAGNOSIS — E785 Hyperlipidemia, unspecified: Secondary | ICD-10-CM | POA: Diagnosis not present

## 2016-09-04 DIAGNOSIS — Z23 Encounter for immunization: Secondary | ICD-10-CM | POA: Diagnosis not present

## 2016-10-03 ENCOUNTER — Encounter: Payer: Self-pay | Admitting: Family Medicine

## 2016-11-01 ENCOUNTER — Encounter: Payer: Self-pay | Admitting: Gastroenterology

## 2016-12-06 DIAGNOSIS — J029 Acute pharyngitis, unspecified: Secondary | ICD-10-CM | POA: Diagnosis not present

## 2016-12-21 ENCOUNTER — Encounter: Payer: Self-pay | Admitting: Gastroenterology

## 2017-01-26 DIAGNOSIS — Z1211 Encounter for screening for malignant neoplasm of colon: Secondary | ICD-10-CM | POA: Diagnosis not present

## 2017-02-23 DIAGNOSIS — M797 Fibromyalgia: Secondary | ICD-10-CM | POA: Diagnosis not present

## 2017-02-23 DIAGNOSIS — E119 Type 2 diabetes mellitus without complications: Secondary | ICD-10-CM | POA: Diagnosis not present

## 2017-02-23 DIAGNOSIS — E7800 Pure hypercholesterolemia, unspecified: Secondary | ICD-10-CM | POA: Diagnosis not present

## 2017-02-23 DIAGNOSIS — F324 Major depressive disorder, single episode, in partial remission: Secondary | ICD-10-CM | POA: Diagnosis not present

## 2017-03-14 ENCOUNTER — Other Ambulatory Visit: Payer: Self-pay | Admitting: Family Medicine

## 2017-03-14 DIAGNOSIS — Z1231 Encounter for screening mammogram for malignant neoplasm of breast: Secondary | ICD-10-CM

## 2017-03-19 ENCOUNTER — Ambulatory Visit
Admission: RE | Admit: 2017-03-19 | Discharge: 2017-03-19 | Disposition: A | Discharge status: Home / Self Care | Payer: BLUE CROSS/BLUE SHIELD | Source: Ambulatory Visit | Attending: Family Medicine | Admitting: Family Medicine

## 2017-03-19 DIAGNOSIS — Z1231 Encounter for screening mammogram for malignant neoplasm of breast: Secondary | ICD-10-CM | POA: Diagnosis not present

## 2017-04-13 DIAGNOSIS — E119 Type 2 diabetes mellitus without complications: Secondary | ICD-10-CM | POA: Diagnosis not present

## 2017-08-31 DIAGNOSIS — G4730 Sleep apnea, unspecified: Secondary | ICD-10-CM | POA: Diagnosis not present

## 2017-08-31 DIAGNOSIS — F324 Major depressive disorder, single episode, in partial remission: Secondary | ICD-10-CM | POA: Diagnosis not present

## 2017-08-31 DIAGNOSIS — E119 Type 2 diabetes mellitus without complications: Secondary | ICD-10-CM | POA: Diagnosis not present

## 2017-08-31 DIAGNOSIS — E7800 Pure hypercholesterolemia, unspecified: Secondary | ICD-10-CM | POA: Diagnosis not present

## 2017-12-12 DIAGNOSIS — E1165 Type 2 diabetes mellitus with hyperglycemia: Secondary | ICD-10-CM | POA: Diagnosis not present

## 2017-12-12 DIAGNOSIS — F324 Major depressive disorder, single episode, in partial remission: Secondary | ICD-10-CM | POA: Diagnosis not present

## 2017-12-12 DIAGNOSIS — E7800 Pure hypercholesterolemia, unspecified: Secondary | ICD-10-CM | POA: Diagnosis not present

## 2017-12-12 DIAGNOSIS — M797 Fibromyalgia: Secondary | ICD-10-CM | POA: Diagnosis not present

## 2018-01-09 ENCOUNTER — Encounter: Payer: BLUE CROSS/BLUE SHIELD | Attending: Family Medicine | Admitting: *Deleted

## 2018-01-09 DIAGNOSIS — Z713 Dietary counseling and surveillance: Secondary | ICD-10-CM | POA: Insufficient documentation

## 2018-01-09 DIAGNOSIS — E119 Type 2 diabetes mellitus without complications: Secondary | ICD-10-CM | POA: Diagnosis not present

## 2018-01-09 NOTE — Progress Notes (Signed)
Diabetes Self-Management Education  Visit Type:  Follow-up  Appt. Start Time: 1000 Appt. End Time: 1100  01/09/2018  [name], identified by name and date of birth, is a 62 y.o. female with a diagnosis of Diabetes:  .  Patient was seen by me back in 2017 and states she needs a review as when she was first diagnosed, she was in denial for a couple of years. She has already changed her eating habits to lower carb choices eaten with protein foods at meals and snacks. She reports a weight loss of 14 pounds in the month of January this year. 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 She states she is testing her BG about 3-4 times a week and reports a BG range of 120-160 mg/dl with highest numbers in the AM.   ASSESSMENT  There were no vitals taken for this visit. There is no height or weight on file to calculate BMI.   Diabetes Self-Management Education - 01/09/18 0954      Health Coping   How would you rate your overall health?  Good  (Pended)       Psychosocial Assessment   Patient Belief/Attitude about Diabetes  Motivated to manage diabetes  (Pended)     Self-care barriers  None  (Pended)     Patient Concerns  Nutrition/Meal planning;Glycemic Control  (Pended)     Special Needs  None  (Pended)     Learning Readiness  Change in progress  (Pended)       Complications   Last HgB A1C per patient/outside source  --  (Pended)  7.5    Have you had a dilated eye exam in the past 12 months?  Yes  (Pended)     Have you had a dental exam in the past 12 months?  Yes  (Pended)     Are you checking your feet?  Yes  (Pended)       Dietary Intake   Breakfast  1/2 piece [employer] Killer Bread, 1 egg    Snack (morning)  occasional if has time- cheese, nuts    Lunch  protein in salad OR meat with vegetables OR Chic Filet salad    Snack (afternoon)  when get's home from work, cashews OR pork  rinds    Dinner  meat, vegetable, occasionally a sweet potato    Snack (evening)  no, except low calorie Marguarita    Beverage(s)  coffee black, diet soda      Subsequent Visit   Since your last visit have you continued or begun to take your medications as prescribed?  Yes  (Pended)     Since your last visit have you experienced any weight changes?  Loss  (Pended)     Weight Loss (lbs)  14  (Pended)     Since your last visit, are you checking your blood glucose at least once a day?  Yes  (Pended)       Learning Objective:  Patient will have a greater understanding of diabetes self-management. Patient education plan is to attend individual and/or group sessions per assessed needs and concerns.  Plan:   Patient Instructions  Plan:  Aim for 2 Carb Choices per meal (30 grams) +/- 1 either way  Aim for 0-1 Carbs per snack if hungry  Include protein in moderation with your meals and snacks Continue reading  food labels for Total Carbohydrate of foods Consider  increasing your activity level by either arm chair exercises OR some water exercises for 15 minutes daily or up to 150 minutes a week as tolerated Continue checking BG at alternate times per day  Continue taking medication as directed by MD  Expected Outcomes:     Education material provided: Living Well with Diabetes, A1C conversion sheet, Meal plan card and Carbohydrate counting sheet, Arm Chair Exercise handout  If problems or questions, patient to contact team via:  Phone  Future DSME appointment: -  PRN, depending on insurance coverage

## 2018-01-09 NOTE — Patient Instructions (Signed)
Plan:  Aim for 2 Carb Choices per meal (30 grams) +/- 1 either way  Aim for 0-1 Carbs per snack if hungry  Include protein in moderation with your meals and snacks Continue reading food labels for Total Carbohydrate of foods Consider  increasing your activity level by either arm chair exercises OR some water exercises for 15 minutes daily or up to 150 minutes a week as tolerated Continue checking BG at alternate times per day  Continue taking medication as directed by MD

## 2018-02-25 ENCOUNTER — Other Ambulatory Visit: Payer: Self-pay | Admitting: Family Medicine

## 2018-02-25 DIAGNOSIS — M85851 Other specified disorders of bone density and structure, right thigh: Secondary | ICD-10-CM

## 2018-02-27 ENCOUNTER — Other Ambulatory Visit: Payer: Self-pay | Admitting: Family Medicine

## 2018-02-27 DIAGNOSIS — Z1231 Encounter for screening mammogram for malignant neoplasm of breast: Secondary | ICD-10-CM

## 2018-03-20 ENCOUNTER — Inpatient Hospital Stay
Admission: RE | Admit: 2018-03-20 | Discharge: 2018-03-20 | Disposition: A | Discharge status: Home / Self Care | Payer: Self-pay | Source: Ambulatory Visit | Attending: Family Medicine | Admitting: Family Medicine

## 2018-03-20 ENCOUNTER — Ambulatory Visit: Payer: Self-pay

## 2018-03-27 DIAGNOSIS — D1801 Hemangioma of skin and subcutaneous tissue: Secondary | ICD-10-CM | POA: Diagnosis not present

## 2018-03-27 DIAGNOSIS — D225 Melanocytic nevi of trunk: Secondary | ICD-10-CM | POA: Diagnosis not present

## 2018-03-27 DIAGNOSIS — L918 Other hypertrophic disorders of the skin: Secondary | ICD-10-CM | POA: Diagnosis not present

## 2018-03-27 DIAGNOSIS — L988 Other specified disorders of the skin and subcutaneous tissue: Secondary | ICD-10-CM | POA: Diagnosis not present

## 2018-03-27 DIAGNOSIS — L821 Other seborrheic keratosis: Secondary | ICD-10-CM | POA: Diagnosis not present

## 2018-03-27 DIAGNOSIS — D485 Neoplasm of uncertain behavior of skin: Secondary | ICD-10-CM | POA: Diagnosis not present

## 2018-03-27 DIAGNOSIS — D171 Benign lipomatous neoplasm of skin and subcutaneous tissue of trunk: Secondary | ICD-10-CM | POA: Diagnosis not present

## 2018-04-19 DIAGNOSIS — E7800 Pure hypercholesterolemia, unspecified: Secondary | ICD-10-CM | POA: Diagnosis not present

## 2018-04-19 DIAGNOSIS — M797 Fibromyalgia: Secondary | ICD-10-CM | POA: Diagnosis not present

## 2018-04-19 DIAGNOSIS — E119 Type 2 diabetes mellitus without complications: Secondary | ICD-10-CM | POA: Diagnosis not present

## 2018-04-19 DIAGNOSIS — F324 Major depressive disorder, single episode, in partial remission: Secondary | ICD-10-CM | POA: Diagnosis not present

## 2018-04-24 ENCOUNTER — Ambulatory Visit
Admission: RE | Admit: 2018-04-24 | Discharge: 2018-04-24 | Disposition: A | Discharge status: Home / Self Care | Payer: BLUE CROSS/BLUE SHIELD | Source: Ambulatory Visit | Attending: Family Medicine | Admitting: Family Medicine

## 2018-04-24 DIAGNOSIS — Z1231 Encounter for screening mammogram for malignant neoplasm of breast: Secondary | ICD-10-CM | POA: Diagnosis not present

## 2018-04-24 DIAGNOSIS — M85851 Other specified disorders of bone density and structure, right thigh: Secondary | ICD-10-CM

## 2018-06-05 ENCOUNTER — Other Ambulatory Visit: Payer: BLUE CROSS/BLUE SHIELD

## 2018-06-08 DIAGNOSIS — R0781 Pleurodynia: Secondary | ICD-10-CM | POA: Diagnosis not present

## 2018-06-08 DIAGNOSIS — M25512 Pain in left shoulder: Secondary | ICD-10-CM | POA: Diagnosis not present

## 2018-06-08 DIAGNOSIS — M25562 Pain in left knee: Secondary | ICD-10-CM | POA: Diagnosis not present

## 2018-06-10 ENCOUNTER — Encounter (INDEPENDENT_AMBULATORY_CARE_PROVIDER_SITE_OTHER): Payer: Self-pay | Admitting: Orthopedic Surgery

## 2018-06-10 ENCOUNTER — Ambulatory Visit (INDEPENDENT_AMBULATORY_CARE_PROVIDER_SITE_OTHER): Payer: BLUE CROSS/BLUE SHIELD | Admitting: Orthopedic Surgery

## 2018-06-10 DIAGNOSIS — M7542 Impingement syndrome of left shoulder: Secondary | ICD-10-CM | POA: Diagnosis not present

## 2018-06-11 ENCOUNTER — Encounter (INDEPENDENT_AMBULATORY_CARE_PROVIDER_SITE_OTHER): Payer: Self-pay | Admitting: Orthopedic Surgery

## 2018-06-11 NOTE — Progress Notes (Signed)
Office Visit Note   Patient: Bonnie Ramos           Date of Birth: 04/25/1956           MRN: [id number] Visit Date: 06/10/2018 Requested by: Johny BlamerHarris, William, MD [phone] [place], [place] [id number] PCP: Johny BlamerHarris, William, MD  Subjective: Chief Complaint  Patient presents with  . Left Shoulder - Injury    HPI: Patient presents for evaluation of left shoulder pain following a fall.  Date of injury 06/07/2018.  The patient fell and landed on her left side.  She has pain with certain ranges of motion.  She is right-hand dominant.  She states that the pain wakes her from sleep at night.  She has gone to [facility] and Toradol injection was given.  She uses Ultram for pain with some relief.  No prior history of injury or surgery to the left shoulder.  She denies any new grinding or popping.  She states it is hard for her to lift her arm up high.  She is been taking Advil for her symptoms.  She works as a Armed forces operational officerdental hygienist but cannot really keep her arm in that position she needs to in order to perform her work duties.              ROS: All systems reviewed are negative as they relate to the chief complaint within the history of present illness.  Patient denies  fevers or chills.   Assessment & Plan: Visit Diagnoses:  1. Impingement syndrome of left shoulder     Plan: Impression is traumatic bursitis left shoulder following a fall with about exam evidence today of large rotator cuff tear.  I think it is possible she may have a small cuff tear or this may represent a contusion or bruising.  Does not sound like the shoulder dislocated from the fall.  I would favor a period of 1 to 2 weeks of observation.  We talked about an injection today but she wants to hold off on that.  We will see her back in 1 to 2 weeks for repeat clinical assessment and to decide if she needs MRI scanning.  Continue with anti-inflammatories.  Out of work note is given.  Follow-Up Instructions:  Return in about 2 weeks (around 06/24/2018).   Orders:  No orders of the defined types were placed in this encounter.  No orders of the defined types were placed in this encounter.     Procedures: No procedures performed   Clinical Data: No additional findings.  Objective: Vital Signs: There were no vitals taken for this visit.  Physical Exam:   Constitutional: Patient appears well-developed HEENT:  Head: Normocephalic Eyes:EOM are normal Neck: Normal range of motion Cardiovascular: Normal rate Pulmonary/chest: Effort normal Neurologic: Patient is alert Skin: Skin is warm Psychiatric: Patient has normal mood and affect    Ortho Exam: Ortho exam demonstrates good cervical spine range of motion.  5 out of 5 grip EPL FPL interosseous wrist flexion wrist extension bicep triceps and deltoid strength.  The patient has pretty good external rotation strength on the right 5+ out of 5.  On the left it is about 5- out of 5.  Subscap strength is intact bilaterally.  Impingement signs equivocal on the left negative on the right.  No discrete AC joint tenderness on the left-hand side compared to the right.  Not much the way of coarse grinding or crepitus with internal and external  rotation on the left-hand side.  Specialty Comments:  No specialty comments available.  Imaging: No results found.   PMFS History: There are no active problems to display for this patient.  Past Medical History:  Diagnosis Date  . Diabetes mellitus without complication (HCC)   . Hyperlipidemia   . Obesity   . Sleep apnea     Family History  Adopted: Yes  Family history unknown: Yes    Past Surgical History:  Procedure Laterality Date  . CESAREAN SECTION    . CHOLECYSTECTOMY    . TONSILECTOMY/ADENOIDECTOMY WITH MYRINGOTOMY  1962   without myringotomy per pt. Entered incorrectly.   Social History   Occupational History  . Not on file  Tobacco Use  . Smoking status: Never Smoker    Substance and Sexual Activity  . Alcohol use: Yes    Comment: occasional  . Drug use: No  . Sexual activity: Not on file

## 2018-07-24 ENCOUNTER — Ambulatory Visit
Admission: RE | Admit: 2018-07-24 | Discharge: 2018-07-24 | Disposition: A | Discharge status: Home / Self Care | Payer: BLUE CROSS/BLUE SHIELD | Source: Ambulatory Visit | Attending: Family Medicine | Admitting: Family Medicine

## 2018-07-24 DIAGNOSIS — M85852 Other specified disorders of bone density and structure, left thigh: Secondary | ICD-10-CM | POA: Diagnosis not present

## 2018-07-24 DIAGNOSIS — Z780 Asymptomatic menopausal state: Secondary | ICD-10-CM | POA: Diagnosis not present

## 2018-07-24 DIAGNOSIS — M85851 Other specified disorders of bone density and structure, right thigh: Secondary | ICD-10-CM | POA: Diagnosis not present

## 2018-09-18 DIAGNOSIS — H6693 Otitis media, unspecified, bilateral: Secondary | ICD-10-CM | POA: Diagnosis not present

## 2018-09-18 DIAGNOSIS — H6123 Impacted cerumen, bilateral: Secondary | ICD-10-CM | POA: Diagnosis not present

## 2018-10-25 ENCOUNTER — Other Ambulatory Visit: Payer: Self-pay | Admitting: Family Medicine

## 2018-10-25 ENCOUNTER — Ambulatory Visit
Admission: RE | Admit: 2018-10-25 | Discharge: 2018-10-25 | Disposition: A | Discharge status: Home / Self Care | Payer: BLUE CROSS/BLUE SHIELD | Source: Ambulatory Visit | Attending: Family Medicine | Admitting: Family Medicine

## 2018-10-25 DIAGNOSIS — M79671 Pain in right foot: Secondary | ICD-10-CM

## 2018-10-25 DIAGNOSIS — F324 Major depressive disorder, single episode, in partial remission: Secondary | ICD-10-CM | POA: Diagnosis not present

## 2018-10-25 DIAGNOSIS — M25571 Pain in right ankle and joints of right foot: Secondary | ICD-10-CM | POA: Diagnosis not present

## 2018-10-25 DIAGNOSIS — E119 Type 2 diabetes mellitus without complications: Secondary | ICD-10-CM | POA: Diagnosis not present

## 2018-10-25 DIAGNOSIS — Z23 Encounter for immunization: Secondary | ICD-10-CM | POA: Diagnosis not present

## 2018-10-25 DIAGNOSIS — E7800 Pure hypercholesterolemia, unspecified: Secondary | ICD-10-CM | POA: Diagnosis not present

## 2019-02-07 DIAGNOSIS — I10 Essential (primary) hypertension: Secondary | ICD-10-CM | POA: Diagnosis not present

## 2019-02-07 DIAGNOSIS — E119 Type 2 diabetes mellitus without complications: Secondary | ICD-10-CM | POA: Diagnosis not present

## 2019-02-07 DIAGNOSIS — Z23 Encounter for immunization: Secondary | ICD-10-CM | POA: Diagnosis not present

## 2019-02-07 DIAGNOSIS — Z0000 Encounter for general adult medical examination without abnormal findings: Secondary | ICD-10-CM | POA: Diagnosis not present

## 2019-02-21 DIAGNOSIS — H11003 Unspecified pterygium of eye, bilateral: Secondary | ICD-10-CM | POA: Diagnosis not present

## 2019-02-21 DIAGNOSIS — E119 Type 2 diabetes mellitus without complications: Secondary | ICD-10-CM | POA: Diagnosis not present

## 2019-02-21 DIAGNOSIS — H524 Presbyopia: Secondary | ICD-10-CM | POA: Diagnosis not present

## 2019-02-21 DIAGNOSIS — Z87891 Personal history of nicotine dependence: Secondary | ICD-10-CM | POA: Diagnosis not present

## 2019-02-21 DIAGNOSIS — H2513 Age-related nuclear cataract, bilateral: Secondary | ICD-10-CM | POA: Diagnosis not present

## 2019-02-21 DIAGNOSIS — I10 Essential (primary) hypertension: Secondary | ICD-10-CM | POA: Diagnosis not present

## 2019-02-21 DIAGNOSIS — H25013 Cortical age-related cataract, bilateral: Secondary | ICD-10-CM | POA: Diagnosis not present

## 2019-02-21 DIAGNOSIS — E782 Mixed hyperlipidemia: Secondary | ICD-10-CM | POA: Diagnosis not present

## 2019-07-17 DIAGNOSIS — I10 Essential (primary) hypertension: Secondary | ICD-10-CM | POA: Diagnosis not present

## 2019-07-17 DIAGNOSIS — Z111 Encounter for screening for respiratory tuberculosis: Secondary | ICD-10-CM | POA: Diagnosis not present

## 2019-07-17 DIAGNOSIS — Z87891 Personal history of nicotine dependence: Secondary | ICD-10-CM | POA: Diagnosis not present

## 2019-07-17 DIAGNOSIS — Z789 Other specified health status: Secondary | ICD-10-CM | POA: Diagnosis not present

## 2019-07-17 DIAGNOSIS — E782 Mixed hyperlipidemia: Secondary | ICD-10-CM | POA: Diagnosis not present

## 2019-07-17 DIAGNOSIS — Z7984 Long term (current) use of oral hypoglycemic drugs: Secondary | ICD-10-CM | POA: Diagnosis not present

## 2019-07-17 DIAGNOSIS — Z1239 Encounter for other screening for malignant neoplasm of breast: Secondary | ICD-10-CM | POA: Diagnosis not present

## 2019-07-17 DIAGNOSIS — E119 Type 2 diabetes mellitus without complications: Secondary | ICD-10-CM | POA: Diagnosis not present

## 2019-08-05 DIAGNOSIS — L309 Dermatitis, unspecified: Secondary | ICD-10-CM | POA: Diagnosis not present

## 2019-08-05 DIAGNOSIS — I10 Essential (primary) hypertension: Secondary | ICD-10-CM | POA: Diagnosis not present

## 2019-08-25 DIAGNOSIS — Z8659 Personal history of other mental and behavioral disorders: Secondary | ICD-10-CM | POA: Diagnosis not present

## 2019-08-25 DIAGNOSIS — Z87891 Personal history of nicotine dependence: Secondary | ICD-10-CM | POA: Diagnosis not present

## 2019-08-25 DIAGNOSIS — I10 Essential (primary) hypertension: Secondary | ICD-10-CM | POA: Diagnosis not present

## 2019-10-01 DIAGNOSIS — Z1231 Encounter for screening mammogram for malignant neoplasm of breast: Secondary | ICD-10-CM | POA: Diagnosis not present

## 2019-11-01 DIAGNOSIS — Z20828 Contact with and (suspected) exposure to other viral communicable diseases: Secondary | ICD-10-CM | POA: Diagnosis not present

## 2019-11-03 DIAGNOSIS — F419 Anxiety disorder, unspecified: Secondary | ICD-10-CM | POA: Diagnosis not present

## 2019-11-03 DIAGNOSIS — E782 Mixed hyperlipidemia: Secondary | ICD-10-CM | POA: Diagnosis not present

## 2019-11-03 DIAGNOSIS — E119 Type 2 diabetes mellitus without complications: Secondary | ICD-10-CM | POA: Diagnosis not present

## 2019-11-03 DIAGNOSIS — I10 Essential (primary) hypertension: Secondary | ICD-10-CM | POA: Diagnosis not present

## 2019-11-19 DIAGNOSIS — I10 Essential (primary) hypertension: Secondary | ICD-10-CM | POA: Diagnosis not present

## 2019-11-19 DIAGNOSIS — E119 Type 2 diabetes mellitus without complications: Secondary | ICD-10-CM | POA: Diagnosis not present

## 2019-11-19 DIAGNOSIS — E782 Mixed hyperlipidemia: Secondary | ICD-10-CM | POA: Diagnosis not present

## 2020-01-05 DIAGNOSIS — D225 Melanocytic nevi of trunk: Secondary | ICD-10-CM | POA: Diagnosis not present

## 2020-01-05 DIAGNOSIS — D2272 Melanocytic nevi of left lower limb, including hip: Secondary | ICD-10-CM | POA: Diagnosis not present

## 2020-01-05 DIAGNOSIS — D2262 Melanocytic nevi of left upper limb, including shoulder: Secondary | ICD-10-CM | POA: Diagnosis not present

## 2020-01-05 DIAGNOSIS — D2271 Melanocytic nevi of right lower limb, including hip: Secondary | ICD-10-CM | POA: Diagnosis not present

## 2020-01-29 DIAGNOSIS — E118 Type 2 diabetes mellitus with unspecified complications: Secondary | ICD-10-CM | POA: Diagnosis not present

## 2020-03-24 ENCOUNTER — Other Ambulatory Visit: Payer: Self-pay | Admitting: Family Medicine

## 2020-03-24 ENCOUNTER — Other Ambulatory Visit (HOSPITAL_COMMUNITY)
Admission: RE | Admit: 2020-03-24 | Discharge: 2020-03-24 | Disposition: A | Discharge status: Home / Self Care | Payer: BC Managed Care – PPO | Source: Ambulatory Visit | Attending: Dermatology | Admitting: Dermatology

## 2020-03-24 DIAGNOSIS — Z0000 Encounter for general adult medical examination without abnormal findings: Secondary | ICD-10-CM | POA: Insufficient documentation

## 2020-03-24 DIAGNOSIS — E7800 Pure hypercholesterolemia, unspecified: Secondary | ICD-10-CM | POA: Diagnosis not present

## 2020-03-24 DIAGNOSIS — E119 Type 2 diabetes mellitus without complications: Secondary | ICD-10-CM | POA: Diagnosis not present

## 2020-03-24 DIAGNOSIS — H6121 Impacted cerumen, right ear: Secondary | ICD-10-CM | POA: Diagnosis not present

## 2020-03-24 DIAGNOSIS — I10 Essential (primary) hypertension: Secondary | ICD-10-CM | POA: Diagnosis not present

## 2020-03-24 DIAGNOSIS — R8281 Pyuria: Secondary | ICD-10-CM | POA: Diagnosis not present

## 2020-03-28 DIAGNOSIS — E118 Type 2 diabetes mellitus with unspecified complications: Secondary | ICD-10-CM | POA: Diagnosis not present

## 2020-03-29 LAB — CYTOLOGY - PAP: Diagnosis: NEGATIVE

## 2020-03-31 DIAGNOSIS — E119 Type 2 diabetes mellitus without complications: Secondary | ICD-10-CM | POA: Diagnosis not present

## 2020-03-31 DIAGNOSIS — H2513 Age-related nuclear cataract, bilateral: Secondary | ICD-10-CM | POA: Diagnosis not present

## 2020-03-31 DIAGNOSIS — H524 Presbyopia: Secondary | ICD-10-CM | POA: Diagnosis not present

## 2020-03-31 DIAGNOSIS — H35033 Hypertensive retinopathy, bilateral: Secondary | ICD-10-CM | POA: Diagnosis not present

## 2020-03-31 DIAGNOSIS — H35013 Changes in retinal vascular appearance, bilateral: Secondary | ICD-10-CM | POA: Diagnosis not present

## 2020-04-27 DIAGNOSIS — E118 Type 2 diabetes mellitus with unspecified complications: Secondary | ICD-10-CM | POA: Diagnosis not present

## 2020-06-07 DIAGNOSIS — L989 Disorder of the skin and subcutaneous tissue, unspecified: Secondary | ICD-10-CM | POA: Diagnosis not present

## 2020-06-08 ENCOUNTER — Other Ambulatory Visit: Payer: Self-pay | Admitting: Family Medicine

## 2020-06-08 DIAGNOSIS — L989 Disorder of the skin and subcutaneous tissue, unspecified: Secondary | ICD-10-CM

## 2020-06-16 ENCOUNTER — Ambulatory Visit
Admission: RE | Admit: 2020-06-16 | Discharge: 2020-06-16 | Disposition: A | Discharge status: Home / Self Care | Payer: BC Managed Care – PPO | Source: Ambulatory Visit | Attending: Family Medicine | Admitting: Family Medicine

## 2020-06-16 DIAGNOSIS — L989 Disorder of the skin and subcutaneous tissue, unspecified: Secondary | ICD-10-CM

## 2020-06-16 DIAGNOSIS — K429 Umbilical hernia without obstruction or gangrene: Secondary | ICD-10-CM | POA: Diagnosis not present

## 2020-06-18 ENCOUNTER — Other Ambulatory Visit: Payer: Self-pay

## 2020-06-18 ENCOUNTER — Other Ambulatory Visit: Payer: Self-pay | Admitting: Family Medicine

## 2020-06-18 ENCOUNTER — Ambulatory Visit
Admission: RE | Admit: 2020-06-18 | Discharge: 2020-06-18 | Disposition: A | Discharge status: Home / Self Care | Payer: BC Managed Care – PPO | Source: Ambulatory Visit | Attending: Family Medicine | Admitting: Family Medicine

## 2020-06-18 DIAGNOSIS — R222 Localized swelling, mass and lump, trunk: Secondary | ICD-10-CM

## 2020-06-18 DIAGNOSIS — K439 Ventral hernia without obstruction or gangrene: Secondary | ICD-10-CM | POA: Diagnosis not present

## 2020-06-18 MED ORDER — IOPAMIDOL (ISOVUE-300) INJECTION 61%
100.0000 mL | Freq: Once | INTRAVENOUS | Status: AC | PRN
  Administered 2020-06-18: 100 mL via INTRAVENOUS

## 2020-06-21 ENCOUNTER — Telehealth: Payer: Self-pay

## 2020-06-22 ENCOUNTER — Ambulatory Visit
Admission: RE | Admit: 2020-06-22 | Discharge: 2020-06-22 | Disposition: A | Discharge status: Home / Self Care | Payer: BC Managed Care – PPO | Source: Ambulatory Visit | Attending: Family Medicine | Admitting: Family Medicine

## 2020-06-22 ENCOUNTER — Other Ambulatory Visit: Payer: Self-pay | Admitting: Family Medicine

## 2020-06-22 DIAGNOSIS — K439 Ventral hernia without obstruction or gangrene: Secondary | ICD-10-CM | POA: Diagnosis not present

## 2020-06-22 DIAGNOSIS — I7090 Unspecified atherosclerosis: Secondary | ICD-10-CM | POA: Diagnosis not present

## 2020-06-22 DIAGNOSIS — K429 Umbilical hernia without obstruction or gangrene: Secondary | ICD-10-CM | POA: Diagnosis not present

## 2020-06-22 DIAGNOSIS — K5730 Diverticulosis of large intestine without perforation or abscess without bleeding: Secondary | ICD-10-CM | POA: Diagnosis not present

## 2020-07-16 DIAGNOSIS — K430 Incisional hernia with obstruction, without gangrene: Secondary | ICD-10-CM | POA: Diagnosis not present

## 2020-09-06 DIAGNOSIS — L218 Other seborrheic dermatitis: Secondary | ICD-10-CM | POA: Diagnosis not present

## 2020-09-27 DIAGNOSIS — F324 Major depressive disorder, single episode, in partial remission: Secondary | ICD-10-CM | POA: Diagnosis not present

## 2020-09-27 DIAGNOSIS — E119 Type 2 diabetes mellitus without complications: Secondary | ICD-10-CM | POA: Diagnosis not present

## 2020-09-27 DIAGNOSIS — E7800 Pure hypercholesterolemia, unspecified: Secondary | ICD-10-CM | POA: Diagnosis not present

## 2020-09-27 DIAGNOSIS — I10 Essential (primary) hypertension: Secondary | ICD-10-CM | POA: Diagnosis not present

## 2020-09-29 ENCOUNTER — Other Ambulatory Visit: Payer: Self-pay | Admitting: Family Medicine

## 2020-09-29 ENCOUNTER — Ambulatory Visit
Admission: RE | Admit: 2020-09-29 | Discharge: 2020-09-29 | Disposition: A | Discharge status: Home / Self Care | Payer: BC Managed Care – PPO | Source: Ambulatory Visit | Attending: Family Medicine | Admitting: Family Medicine

## 2020-09-29 DIAGNOSIS — M542 Cervicalgia: Secondary | ICD-10-CM | POA: Diagnosis not present

## 2020-10-13 DIAGNOSIS — Z20822 Contact with and (suspected) exposure to covid-19: Secondary | ICD-10-CM | POA: Diagnosis not present

## 2020-10-14 ENCOUNTER — Ambulatory Visit: Payer: Self-pay | Admitting: Surgery

## 2020-10-14 DIAGNOSIS — K430 Incisional hernia with obstruction, without gangrene: Secondary | ICD-10-CM | POA: Diagnosis not present

## 2020-10-14 NOTE — H&P (Signed)
Surgical Evaluation   HUT:Bonnie Ramos for follow-up. She's been doing well. No new symptoms from the hernias. Weight loss has stabilized.  Last visit 3 mos ago: A pleasant 64 year old woman referred for evaluation of an abdominal wall mass. She has enrolled in a weight loss program through her insurance and has been successful losing about 45 pounds this year. She is essentially doing a ketogenic diet. As she has lost weight she has noticed a mass just to the left and inferior to the umbilicus as well as an additional small mass in the midline above the umbilicus. Does not have any pain from this, denies any change in bowel function that does struggle somewhat with constipation at baseline. Prior abdominal surgery includes C-section via vertical lower midline, followed by open incisional hernia repair of the same with mesh, and subsequently laparoscopic cholecystectomy in 2012.  Allergies  Allergen Reactions  . Erythromycin Diarrhea    Past Medical History:  Diagnosis Date  . Diabetes mellitus without complication (HCC)   . Hyperlipidemia   . Obesity   . Sleep apnea     Past Surgical History:  Procedure Laterality Date  . CESAREAN SECTION    . CHOLECYSTECTOMY    . TONSILECTOMY/ADENOIDECTOMY WITH MYRINGOTOMY  1962   without myringotomy per pt. Entered incorrectly.    Family History  Adopted: Yes  Family history unknown: Yes    Social History   Socioeconomic History  . Marital status: Married    Spouse name: Not on file  . Number of children: Not on file  . Years of education: Not on file  . Highest education level: Not on file  Occupational History  . Not on file  Tobacco Use  . Smoking status: Never Smoker  . Smokeless tobacco: Never Used  Substance and Sexual Activity  . Alcohol use: Yes    Comment: occasional  . Drug use: No  . Sexual activity: Not on file  Other Topics Concern  . Not on file  Social History Narrative  . Not on file   Social Determinants  of Health   Financial Resource Strain:   . Difficulty of Paying Living Expenses: Not on file  Food Insecurity:   . Worried About Programme researcher, broadcasting/film/video in the Last Year: Not on file  . Ran Out of Food in the Last Year: Not on file  Transportation Needs:   . Lack of Transportation (Medical): Not on file  . Lack of Transportation (Non-Medical): Not on file  Physical Activity:   . Days of Exercise per Week: Not on file  . Minutes of Exercise per Session: Not on file  Stress:   . Feeling of Stress : Not on file  Social Connections:   . Frequency of Communication with Friends and Family: Not on file  . Frequency of Social Gatherings with Friends and Family: Not on file  . Attends Religious Services: Not on file  . Active Member of Clubs or Organizations: Not on file  . Attends Banker Meetings: Not on file  . Marital Status: Not on file    Current Outpatient Medications on File Prior to Visit  Medication Sig Dispense Refill  . aspirin 81 MG tablet Take 81 mg by mouth daily.    Marland Kitchen buPROPion (WELLBUTRIN XL) 300 MG 24 hr tablet Take 300 mg by mouth daily.    . DULoxetine (CYMBALTA) 30 MG capsule Take 30 mg by mouth daily.    Marland Kitchen loratadine (CLARITIN) 10 MG tablet Take 10 mg by  mouth daily.    . metFORMIN (GLUCOPHAGE) 500 MG tablet Take 500 mg by mouth daily with supper.     . pregabalin (LYRICA) 150 MG capsule Take 150 mg by mouth 2 (two) times daily.    . Red Yeast Rice Extract (RED YEAST RICE PO) Take 1,200 mg by mouth 2 (two) times daily.      No current facility-administered medications on file prior to visit.    Review of Systems: a complete, 10pt review of systems was completed with pertinent positives and negatives as documented in the HPI  Physical Exam: Vitals Weight: 214.5 lb Height: 64in Body Surface Area: 2.02 m Body Mass Index: 36.82 kg/m  Temp.: 97.68F  Pulse: 74 (Regular)  P.OX: 98% (Room air) BP: 128/70(Sitting, Left Arm, Standard)  Vaguely  palpable infraumbilical mass more prominent in the left side but some component on the right side as well. Multiple prior surgical scars.   CBC Latest Ref Rng & Units 03/10/2015  WBC 4.0 - 10.5 K/uL 8.2  Hemoglobin 12.0 - 15.0 g/dL 86.7  Hematocrit 36 - 46 % 44.6  Platelets 150 - 400 K/uL 238    CMP Latest Ref Rng & Units 03/10/2015  Glucose 70 - 99 mg/dL 672(C)  BUN 6 - 23 mg/dL 14  Creatinine 9.47 - 0.96 mg/dL 2.83  Sodium 662 - 947 mmol/L 140  Potassium 3.5 - 5.1 mmol/L 3.7  Chloride 96 - 112 mmol/L 101  CO2 19 - 32 mmol/L 28  Calcium 8.4 - 10.5 mg/dL 9.3  Total Protein 6.0 - 8.3 g/dL 7.6  Total Bilirubin 0.3 - 1.2 mg/dL 0.8  Alkaline Phos 39 - 117 U/L 101  AST 0 - 37 U/L 21  ALT 0 - 35 U/L 24    No results found for: INR, PROTIME  Imaging: No results found.   A/P: INCARCERATED INCISIONAL HERNIA (K43.0) Story: Has a small supraumbilical and infraumbilical hernia, both defects or small however the lower defect as containing a large volume of incarcerated fat. I would recommend a laparoscopic repair with mesh and we discussed the procedure in detail, discussed risks of bleeding, infection, pain, scarring, injury to intra-abdominal structures (specifically to bowel, as she may have some adhesive disease related to her prior mesh repair), conversion to open surgery, wound healing problems such as seroma or hematoma, hernia recurrence, as well as systemic cardiovascular/coronary/thromboembolic risks. Questions were welcomed and answered. Proceed with scheduling. She understands signs and symptoms that should prompt her to seek more urgent treatment.    There are no problems to display for this patient.      Phylliss Blakes, MD [facility], [facility], [place]  See AMION to contact appropriate on-call provider

## 2020-10-25 DIAGNOSIS — M542 Cervicalgia: Secondary | ICD-10-CM | POA: Diagnosis not present

## 2020-11-27 DIAGNOSIS — E118 Type 2 diabetes mellitus with unspecified complications: Secondary | ICD-10-CM | POA: Diagnosis not present

## 2020-12-21 ENCOUNTER — Other Ambulatory Visit (HOSPITAL_COMMUNITY): Payer: BC Managed Care – PPO

## 2020-12-21 ENCOUNTER — Encounter (HOSPITAL_COMMUNITY): Payer: BC Managed Care – PPO

## 2021-01-24 ENCOUNTER — Ambulatory Visit: Payer: Self-pay | Admitting: Surgery

## 2021-01-24 NOTE — H&P (Signed)
Surgical Evaluation   Chief Complaint: hernia  HPI: Returns for follow-up.  She's been doing well.  No new symptoms from the hernias.  Weight loss has stabilized.  Last visit 3 mos ago: A pleasant 64-year-old woman referred for evaluation of an abdominal wall mass. She has enrolled in a weight loss program through her insurance and has been successful losing about 45 pounds this year.  She is essentially doing a ketogenic diet. As she has lost weight she has noticed a mass just to the left and inferior to the umbilicus as well as an additional small mass in the midline above the umbilicus.  Does not have any pain from this, denies any change in bowel function that does struggle somewhat with constipation at baseline. Prior abdominal surgery includes C-section via vertical lower midline, followed by open incisional hernia repair of the same with mesh, and subsequently laparoscopic cholecystectomy in 2012.   Allergies  Allergen Reactions   Erythromycin Diarrhea    Past Medical History:  Diagnosis Date   Diabetes mellitus without complication (HCC)    Hyperlipidemia    Obesity    Sleep apnea     Past Surgical History:  Procedure Laterality Date   CESAREAN SECTION     CHOLECYSTECTOMY     TONSILECTOMY/ADENOIDECTOMY WITH MYRINGOTOMY  1962   without myringotomy per pt. Entered incorrectly.    Family History  Adopted: Yes  Family history unknown: Yes    Social History   Socioeconomic History   Marital status: Married    Spouse name: Not on file   Number of children: Not on file   Years of education: Not on file   Highest education level: Not on file  Occupational History   Not on file  Tobacco Use   Smoking status: Never Smoker   Smokeless tobacco: Never Used  Substance and Sexual Activity   Alcohol use: Yes    Comment: occasional   Drug use: No   Sexual activity: Not on file  Other Topics Concern   Not on file  Social History Narrative   Not on file   Social  Determinants of Health   Financial Resource Strain: Not on file  Food Insecurity: Not on file  Transportation Needs: Not on file  Physical Activity: Not on file  Stress: Not on file  Social Connections: Not on file    Current Outpatient Medications on File Prior to Visit  Medication Sig Dispense Refill   amLODipine (NORVASC) 5 MG tablet Take 5 mg by mouth daily.     aspirin 81 MG tablet Take 81 mg by mouth See admin instructions. Take 81 mg on Sun, Tue, Thurs, and Sat     buPROPion (WELLBUTRIN XL) 300 MG 24 hr tablet Take 300 mg by mouth daily.     Ca Phosphate-Cholecalciferol (CALCIUM 500 + D3) 250-500 MG-UNIT CHEW Chew 1 tablet by mouth 2 (two) times daily.     DULoxetine (CYMBALTA) 30 MG capsule Take 30 mg by mouth daily.     ibuprofen (ADVIL) 200 MG tablet Take 600 mg by mouth 2 (two) times daily as needed for headache or moderate pain.     loratadine (CLARITIN) 10 MG tablet Take 10 mg by mouth daily.     metFORMIN (GLUCOPHAGE-XR) 500 MG 24 hr tablet Take 500 mg by mouth at bedtime.     pregabalin (LYRICA) 150 MG capsule Take 150 mg by mouth 2 (two) times daily.     Red Yeast Rice Extract (RED YEAST RICE PO)   Take 1,200 mg by mouth 2 (two) times daily.      traMADol (ULTRAM) 50 MG tablet Take 50 mg by mouth daily as needed for pain.     valACYclovir (VALTREX) 1000 MG tablet Take 2,000 mg by mouth 2 (two) times daily as needed (fever blisters).     No current facility-administered medications on file prior to visit.    Review of Systems: a complete, 10pt review of systems was completed with pertinent positives and negatives as documented in the HPI  Physical Exam: Vitals  Weight: 214.5 lb   Height: 64 in  Body Surface Area: 2.02 m   Body Mass Index: 36.82 kg/m   Temp.: 97.9 F    Pulse: 74 (Regular)    P.OX: 98% (Room air) BP: 128/70(Sitting, Left Arm, Standard)   Physical exam findings are as follows: Note:  Vaguely palpable infraumbilical mass more prominent in the left  side but some component on the right side as well. Multiple prior surgical scars.    CBC Latest Ref Rng & Units 03/10/2015  WBC 4.0 - 10.5 K/uL 8.2  Hemoglobin 12.0 - 15.0 g/dL 94.8  Hematocrit 54.6 - 46.0 % 44.6  Platelets 150 - 400 K/uL 238    CMP Latest Ref Rng & Units 03/10/2015  Glucose 70 - 99 mg/dL 270(J)  BUN 6 - 23 mg/dL 14  Creatinine 5.00 - 9.38 mg/dL 1.82  Sodium 993 - 716 mmol/L 140  Potassium 3.5 - 5.1 mmol/L 3.7  Chloride 96 - 112 mmol/L 101  CO2 19 - 32 mmol/L 28  Calcium 8.4 - 10.5 mg/dL 9.3  Total Protein 6.0 - 8.3 g/dL 7.6  Total Bilirubin 0.3 - 1.2 mg/dL 0.8  Alkaline Phos 39 - 117 U/L 101  AST 0 - 37 U/L 21  ALT 0 - 35 U/L 24    No results found for: INR, PROTIME  Imaging: No results found.   A/P: INCARCERATED INCISIONAL HERNIA (K43.0) Story: Has a small supraumbilical and infraumbilical hernia, both defects or small however the lower defect as containing a large volume of incarcerated fat. I would recommend a laparoscopic repair with mesh and we discussed the procedure in detail, discussed risks of bleeding, infection, pain, scarring, injury to intra-abdominal structures (specifically to bowel, as she may have some adhesive disease related to her prior mesh repair), conversion to open surgery, wound healing problems such as seroma or hematoma, hernia recurrence, as well as systemic cardiovascular/coronary/thromboembolic risks. Questions were welcomed and answered. Proceed with scheduling. She understands signs and symptoms that should prompt her to seek more urgent treatment.    There are no problems to display for this patient.      Phylliss Blakes, MD [facility], [place]  See AMION to contact appropriate on-call provider

## 2021-01-24 NOTE — H&P (View-Only) (Signed)
Surgical Evaluation   Chief Complaint: hernia  HPI: Returns for follow-up.  She's been doing well.  No new symptoms from the hernias.  Weight loss has stabilized.  Last visit 3 mos ago: A pleasant 65 year old woman referred for evaluation of an abdominal wall mass. She has enrolled in a weight loss program through her insurance and has been successful losing about 45 pounds this year.  She is essentially doing a ketogenic diet. As she has lost weight she has noticed a mass just to the left and inferior to the umbilicus as well as an additional small mass in the midline above the umbilicus.  Does not have any pain from this, denies any change in bowel function that does struggle somewhat with constipation at baseline. Prior abdominal surgery includes C-section via vertical lower midline, followed by open incisional hernia repair of the same with mesh, and subsequently laparoscopic cholecystectomy in 2012.   Allergies  Allergen Reactions   Erythromycin Diarrhea    Past Medical History:  Diagnosis Date   Diabetes mellitus without complication (HCC)    Hyperlipidemia    Obesity    Sleep apnea     Past Surgical History:  Procedure Laterality Date   CESAREAN SECTION     CHOLECYSTECTOMY     TONSILECTOMY/ADENOIDECTOMY WITH MYRINGOTOMY  1962   without myringotomy per pt. Entered incorrectly.    Family History  Adopted: Yes  Family history unknown: Yes    Social History   Socioeconomic History   Marital status: Married    Spouse name: Not on file   Number of children: Not on file   Years of education: Not on file   Highest education level: Not on file  Occupational History   Not on file  Tobacco Use   Smoking status: Never Smoker   Smokeless tobacco: Never Used  Substance and Sexual Activity   Alcohol use: Yes    Comment: occasional   Drug use: No   Sexual activity: Not on file  Other Topics Concern   Not on file  Social History Narrative   Not on file   Social  Determinants of Health   Financial Resource Strain: Not on file  Food Insecurity: Not on file  Transportation Needs: Not on file  Physical Activity: Not on file  Stress: Not on file  Social Connections: Not on file    Current Outpatient Medications on File Prior to Visit  Medication Sig Dispense Refill   amLODipine (NORVASC) 5 MG tablet Take 5 mg by mouth daily.     aspirin 81 MG tablet Take 81 mg by mouth See admin instructions. Take 81 mg on Sun, Tue, Thurs, and Sat     buPROPion (WELLBUTRIN XL) 300 MG 24 hr tablet Take 300 mg by mouth daily.     Ca Phosphate-Cholecalciferol (CALCIUM 500 + D3) 250-500 MG-UNIT CHEW Chew 1 tablet by mouth 2 (two) times daily.     DULoxetine (CYMBALTA) 30 MG capsule Take 30 mg by mouth daily.     ibuprofen (ADVIL) 200 MG tablet Take 600 mg by mouth 2 (two) times daily as needed for headache or moderate pain.     loratadine (CLARITIN) 10 MG tablet Take 10 mg by mouth daily.     metFORMIN (GLUCOPHAGE-XR) 500 MG 24 hr tablet Take 500 mg by mouth at bedtime.     pregabalin (LYRICA) 150 MG capsule Take 150 mg by mouth 2 (two) times daily.     Red Yeast Rice Extract (RED YEAST RICE PO)  Take 1,200 mg by mouth 2 (two) times daily.      traMADol (ULTRAM) 50 MG tablet Take 50 mg by mouth daily as needed for pain.     valACYclovir (VALTREX) 1000 MG tablet Take 2,000 mg by mouth 2 (two) times daily as needed (fever blisters).     No current facility-administered medications on file prior to visit.    Review of Systems: a complete, 10pt review of systems was completed with pertinent positives and negatives as documented in the HPI  Physical Exam: Vitals  Weight: 214.5 lb   Height: 64 in  Body Surface Area: 2.02 m   Body Mass Index: 36.82 kg/m   Temp.: 97.9 F    Pulse: 74 (Regular)    P.OX: 98% (Room air) BP: 128/70(Sitting, Left Arm, Standard)   Physical exam findings are as follows: Note:  Vaguely palpable infraumbilical mass more prominent in the left  side but some component on the right side as well. Multiple prior surgical scars.    CBC Latest Ref Rng & Units 03/10/2015  WBC 4.0 - 10.5 K/uL 8.2  Hemoglobin 12.0 - 15.0 g/dL 94.8  Hematocrit 54.6 - 46.0 % 44.6  Platelets 150 - 400 K/uL 238    CMP Latest Ref Rng & Units 03/10/2015  Glucose 70 - 99 mg/dL 270(J)  BUN 6 - 23 mg/dL 14  Creatinine 5.00 - 9.38 mg/dL 1.82  Sodium 993 - 716 mmol/L 140  Potassium 3.5 - 5.1 mmol/L 3.7  Chloride 96 - 112 mmol/L 101  CO2 19 - 32 mmol/L 28  Calcium 8.4 - 10.5 mg/dL 9.3  Total Protein 6.0 - 8.3 g/dL 7.6  Total Bilirubin 0.3 - 1.2 mg/dL 0.8  Alkaline Phos 39 - 117 U/L 101  AST 0 - 37 U/L 21  ALT 0 - 35 U/L 24    No results found for: INR, PROTIME  Imaging: No results found.   A/P: INCARCERATED INCISIONAL HERNIA (K43.0) Story: Has a small supraumbilical and infraumbilical hernia, both defects or small however the lower defect as containing a large volume of incarcerated fat. I would recommend a laparoscopic repair with mesh and we discussed the procedure in detail, discussed risks of bleeding, infection, pain, scarring, injury to intra-abdominal structures (specifically to bowel, as she may have some adhesive disease related to her prior mesh repair), conversion to open surgery, wound healing problems such as seroma or hematoma, hernia recurrence, as well as systemic cardiovascular/coronary/thromboembolic risks. Questions were welcomed and answered. Proceed with scheduling. She understands signs and symptoms that should prompt her to seek more urgent treatment.    There are no problems to display for this patient.      Phylliss Blakes, MD [facility], [place]  See AMION to contact appropriate on-call provider

## 2021-01-28 DIAGNOSIS — E118 Type 2 diabetes mellitus with unspecified complications: Secondary | ICD-10-CM | POA: Diagnosis not present

## 2021-01-28 NOTE — Progress Notes (Signed)
DUE TO COVID-19 ONLY ONE VISITOR IS ALLOWED TO COME WITH YOU AND STAY IN THE WAITING ROOM ONLY DURING PRE OP AND PROCEDURE DAY OF SURGERY. THE 1 VISITOR  MAY VISIT WITH YOU AFTER SURGERY IN YOUR PRIVATE ROOM DURING VISITING HOURS ONLY!  YOU NEED TO HAVE A COVID 19 TEST ON___3/12/2020 ____ @_______ , THIS TEST MUST BE DONE BEFORE SURGERY,  COVID TESTING SITE [address] [place] , IT IS ON THE RIGHT GOING OUT [address] APPROXIMATELY  2 MINUTES PAST [employer] ON THE RIGHT. ONCE YOUR COVID TEST IS COMPLETED,  PLEASE BEGIN THE QUARANTINE INSTRUCTIONS AS OUTLINED IN YOUR HANDOUT.                Bonnie Ramos  01/28/2021   Your procedure is scheduled on: 02/11/2021    Report to [facility] Main  Entrance   Report to admitting at     0530 AM     Call this number if you have problems the morning of surgery [phone]    Remember: Do not eat food , candy gum or mints :After Midnight. You may have clear liquids from midnight until     CLEAR LIQUID DIET   Foods Allowed                                                                       Coffee and tea, regular and decaf                              Plain Jell-O any favor except red or purple                                            Fruit ices (not with fruit pulp)                                      Iced Popsicles                                     Carbonated beverages, regular and diet                                    Cranberry, grape and apple juices Sports drinks like Gatorade Lightly seasoned clear broth or consume(fat free) Sugar, honey syrup   _____________________________________________________________________    BRUSH YOUR TEETH MORNING OF SURGERY AND RINSE YOUR MOUTH OUT, NO CHEWING GUM CANDY OR MINTS.     Take these medicines the morning of surgery with A SIP OF WATER: amlodpine, wellbutrin, cymbalta, claritin, lyrica   DO NOT TAKE ANY DIABETIC MEDICATIONS DAY OF YOUR  SURGERY                               You may not have  any metal on your body including hair pins and              piercings  Do not wear jewelry, make-up, lotions, powders or perfumes, deodorant             Do not wear nail polish on your fingernails.  Do not shave  48 hours prior to surgery.              Men may shave face and neck.   Do not bring valuables to the hospital. [place].  Contacts, dentures or bridgework may not be worn into surgery.  Leave suitcase in the car. After surgery it may be brought to your room.     Patients discharged the day of surgery will not be allowed to drive home. IF YOU ARE HAVING SURGERY AND GOING HOME THE SAME DAY, YOU MUST HAVE AN ADULT TO DRIVE YOU HOME AND BE WITH YOU FOR 24 HOURS. YOU MAY GO HOME BY TAXI OR UBER OR ORTHERWISE, BUT AN ADULT MUST ACCOMPANY YOU HOME AND STAY WITH YOU FOR 24 HOURS.  Name and phone number of your driver:  Special Instructions: N/A              Please read over the following fact sheets you were given: _____________________________________________________________________  [facility] - Preparing for Surgery Before surgery, you can play an important role.  Because skin is not sterile, your skin needs to be as free of germs as possible.  You can reduce the number of germs on your skin by washing with CHG (chlorahexidine gluconate) soap before surgery.  CHG is an antiseptic cleaner which kills germs and bonds with the skin to continue killing germs even after washing. Please DO NOT use if you have an allergy to CHG or antibacterial soaps.  If your skin becomes reddened/irritated stop using the CHG and inform your nurse when you arrive at Short Stay. Do not shave (including legs and underarms) for at least 48 hours prior to the first CHG shower.  You may shave your face/neck. Please follow these instructions carefully:  1.  Shower with CHG Soap the night before surgery and the   morning of Surgery.  2.  If you choose to wash your hair, wash your hair first as usual with your  normal  shampoo.  3.  After you shampoo, rinse your hair and body thoroughly to remove the  shampoo.                           4.  Use CHG as you would any other liquid soap.  You can apply chg directly  to the skin and wash                       Gently with a scrungie or clean washcloth.  5.  Apply the CHG Soap to your body ONLY FROM THE NECK DOWN.   Do not use on face/ open                           Wound or open sores. Avoid contact with eyes, ears mouth and genitals (private parts).                       Wash  face,  Genitals (private parts) with your normal soap.             6.  Wash thoroughly, paying special attention to the area where your surgery  will be performed.  7.  Thoroughly rinse your body with warm water from the neck down.  8.  DO NOT shower/wash with your normal soap after using and rinsing off  the CHG Soap.                9.  Pat yourself dry with a clean towel.            10.  Wear clean pajamas.            11.  Place clean sheets on your bed the night of your first shower and do not  sleep with pets. Day of Surgery : Do not apply any lotions/deodorants the morning of surgery.  Please wear clean clothes to the hospital/surgery center.  FAILURE TO FOLLOW THESE INSTRUCTIONS MAY RESULT IN THE CANCELLATION OF YOUR SURGERY PATIENT SIGNATURE_________________________________  NURSE SIGNATURE__________________________________  ________________________________________________________________________

## 2021-02-02 ENCOUNTER — Encounter (HOSPITAL_COMMUNITY)
Admission: RE | Admit: 2021-02-02 | Discharge: 2021-02-02 | Disposition: A | Discharge status: Home / Self Care | Payer: BC Managed Care – PPO | Source: Ambulatory Visit | Attending: Surgery | Admitting: Surgery

## 2021-02-02 ENCOUNTER — Other Ambulatory Visit: Payer: Self-pay

## 2021-02-02 ENCOUNTER — Encounter (HOSPITAL_COMMUNITY): Payer: Self-pay

## 2021-02-02 DIAGNOSIS — Z01818 Encounter for other preprocedural examination: Secondary | ICD-10-CM | POA: Diagnosis not present

## 2021-02-02 HISTORY — DX: Fibromyalgia: M79.7

## 2021-02-02 HISTORY — DX: Essential (primary) hypertension: I10

## 2021-02-02 HISTORY — DX: Depression, unspecified: F32.A

## 2021-02-02 HISTORY — DX: Anxiety disorder, unspecified: F41.9

## 2021-02-02 LAB — GLUCOSE, CAPILLARY: Glucose-Capillary: 103 mg/dL — ABNORMAL HIGH (ref 70–99)

## 2021-02-02 LAB — CBC WITH DIFFERENTIAL/PLATELET
Abs Immature Granulocytes: 0.01 10*3/uL (ref 0.00–0.07)
Basophils Absolute: 0 10*3/uL (ref 0.0–0.1)
Basophils Relative: 1 %
Eosinophils Absolute: 0.3 10*3/uL (ref 0.0–0.5)
Eosinophils Relative: 5 %
HCT: 41.8 % (ref 36.0–46.0)
Hemoglobin: 13.9 g/dL (ref 12.0–15.0)
Immature Granulocytes: 0 %
Lymphocytes Relative: 27 %
Lymphs Abs: 1.5 10*3/uL (ref 0.7–4.0)
MCH: 32.4 pg (ref 26.0–34.0)
MCHC: 33.3 g/dL (ref 30.0–36.0)
MCV: 97.4 fL (ref 80.0–100.0)
Monocytes Absolute: 0.5 10*3/uL (ref 0.1–1.0)
Monocytes Relative: 10 %
Neutro Abs: 3.2 10*3/uL (ref 1.7–7.7)
Neutrophils Relative %: 57 %
Platelets: 234 10*3/uL (ref 150–400)
RBC: 4.29 MIL/uL (ref 3.87–5.11)
RDW: 13 % (ref 11.5–15.5)
WBC: 5.6 10*3/uL (ref 4.0–10.5)
nRBC: 0 % (ref 0.0–0.2)

## 2021-02-02 LAB — BASIC METABOLIC PANEL
Anion gap: 9 (ref 5–15)
BUN: 18 mg/dL (ref 8–23)
CO2: 29 mmol/L (ref 22–32)
Calcium: 9.4 mg/dL (ref 8.9–10.3)
Chloride: 103 mmol/L (ref 98–111)
Creatinine, Ser: 0.82 mg/dL (ref 0.44–1.00)
GFR, Estimated: >60 mL/min (ref >60)
Glucose, Bld: 103 mg/dL — ABNORMAL HIGH (ref 70–99)
Potassium: 4.3 mmol/L (ref 3.5–5.1)
Sodium: 141 mmol/L (ref 135–145)

## 2021-02-02 LAB — HEMOGLOBIN A1C
Hgb A1c MFr Bld: 5.9 % — ABNORMAL HIGH (ref 4.8–5.6)
Mean Plasma Glucose: 122.63 mg/dL

## 2021-02-08 ENCOUNTER — Other Ambulatory Visit (HOSPITAL_COMMUNITY)
Admission: RE | Admit: 2021-02-08 | Discharge: 2021-02-08 | Disposition: A | Discharge status: Home / Self Care | Payer: BC Managed Care – PPO | Source: Ambulatory Visit | Attending: Surgery | Admitting: Surgery

## 2021-02-08 DIAGNOSIS — Z20822 Contact with and (suspected) exposure to covid-19: Secondary | ICD-10-CM | POA: Diagnosis not present

## 2021-02-08 DIAGNOSIS — Z01812 Encounter for preprocedural laboratory examination: Secondary | ICD-10-CM | POA: Diagnosis not present

## 2021-02-08 LAB — SARS CORONAVIRUS 2 (TAT 6-24 HRS): SARS Coronavirus 2: NEGATIVE

## 2021-02-10 MED ORDER — BUPIVACAINE LIPOSOME 1.3 % IJ SUSP
20.0000 mL | INTRAMUSCULAR | Status: DC
  Filled 2021-02-10: qty 20

## 2021-02-10 NOTE — Anesthesia Preprocedure Evaluation (Addendum)
Anesthesia Evaluation  Patient identified by MRN, date of birth, ID band Patient awake    Reviewed: Allergy & Precautions, NPO status , Patient's Chart, lab work & pertinent test results  History of Anesthesia Complications Negative for: history of anesthetic complications  Airway Mallampati: III  TM Distance: >3 FB Neck ROM: Full    Dental  (+) Dental Advisory Given, Teeth Intact   Pulmonary sleep apnea and Continuous Positive Airway Pressure Ventilation , former smoker,    Pulmonary exam normal        Cardiovascular hypertension, Pt. on medications Normal cardiovascular exam     Neuro/Psych PSYCHIATRIC DISORDERS Anxiety Depression negative neurological ROS     GI/Hepatic negative GI ROS, Neg liver ROS,   Endo/Other  diabetes, Type 2, Oral Hypoglycemic AgentsMorbid obesity  Renal/GU negative Renal ROS     Musculoskeletal  (+) Fibromyalgia -  Abdominal   Peds  Hematology negative hematology ROS (+)   Anesthesia Other Findings Covid test negative   Reproductive/Obstetrics                            Anesthesia Physical Anesthesia Plan  ASA: III  Anesthesia Plan: General   Post-op Pain Management:    Induction: Intravenous  PONV Risk Score and Plan: 3 and Treatment may vary due to age or medical condition, Ondansetron, Dexamethasone and Midazolam  Airway Management Planned: Oral ETT  Additional Equipment: None  Intra-op Plan:   Post-operative Plan: Extubation in OR  Informed Consent: I have reviewed the patients History and Physical, chart, labs and discussed the procedure including the risks, benefits and alternatives for the proposed anesthesia with the patient or authorized representative who has indicated his/her understanding and acceptance.     Dental advisory given  Plan Discussed with: CRNA and Anesthesiologist  Anesthesia Plan Comments:        Anesthesia  Quick Evaluation

## 2021-02-11 ENCOUNTER — Ambulatory Visit (HOSPITAL_COMMUNITY): Payer: BC Managed Care – PPO | Admitting: Physician Assistant

## 2021-02-11 ENCOUNTER — Ambulatory Visit (HOSPITAL_COMMUNITY)
Admission: RE | Admit: 2021-02-11 | Discharge: 2021-02-11 | Disposition: A | Discharge status: Home / Self Care | Payer: BC Managed Care – PPO | Source: Other Acute Inpatient Hospital | Attending: Surgery | Admitting: Surgery

## 2021-02-11 ENCOUNTER — Ambulatory Visit (HOSPITAL_COMMUNITY): Payer: BC Managed Care – PPO | Admitting: Certified Registered Nurse Anesthetist

## 2021-02-11 ENCOUNTER — Encounter (HOSPITAL_COMMUNITY): Payer: Self-pay | Admitting: Surgery

## 2021-02-11 ENCOUNTER — Encounter (HOSPITAL_COMMUNITY)
Admission: RE | Disposition: A | Discharge status: Home / Self Care | Payer: Self-pay | Source: Other Acute Inpatient Hospital | Attending: Surgery

## 2021-02-11 DIAGNOSIS — K430 Incisional hernia with obstruction, without gangrene: Secondary | ICD-10-CM | POA: Insufficient documentation

## 2021-02-11 DIAGNOSIS — Z881 Allergy status to other antibiotic agents status: Secondary | ICD-10-CM | POA: Diagnosis not present

## 2021-02-11 DIAGNOSIS — Z79899 Other long term (current) drug therapy: Secondary | ICD-10-CM | POA: Diagnosis not present

## 2021-02-11 DIAGNOSIS — E119 Type 2 diabetes mellitus without complications: Secondary | ICD-10-CM | POA: Diagnosis not present

## 2021-02-11 DIAGNOSIS — K436 Other and unspecified ventral hernia with obstruction, without gangrene: Secondary | ICD-10-CM | POA: Diagnosis not present

## 2021-02-11 DIAGNOSIS — Z9049 Acquired absence of other specified parts of digestive tract: Secondary | ICD-10-CM | POA: Diagnosis not present

## 2021-02-11 DIAGNOSIS — Z7984 Long term (current) use of oral hypoglycemic drugs: Secondary | ICD-10-CM | POA: Insufficient documentation

## 2021-02-11 DIAGNOSIS — Z791 Long term (current) use of non-steroidal anti-inflammatories (NSAID): Secondary | ICD-10-CM | POA: Insufficient documentation

## 2021-02-11 DIAGNOSIS — I10 Essential (primary) hypertension: Secondary | ICD-10-CM | POA: Diagnosis not present

## 2021-02-11 DIAGNOSIS — Z7982 Long term (current) use of aspirin: Secondary | ICD-10-CM | POA: Insufficient documentation

## 2021-02-11 DIAGNOSIS — K420 Umbilical hernia with obstruction, without gangrene: Secondary | ICD-10-CM | POA: Diagnosis not present

## 2021-02-11 HISTORY — PX: INCISIONAL HERNIA REPAIR: SHX193

## 2021-02-11 LAB — GLUCOSE, CAPILLARY
Glucose-Capillary: 111 mg/dL — ABNORMAL HIGH (ref 70–99)
Glucose-Capillary: 142 mg/dL — ABNORMAL HIGH (ref 70–99)

## 2021-02-11 SURGERY — REPAIR, HERNIA, INCISIONAL, LAPAROSCOPIC
Anesthesia: General | Site: Abdomen

## 2021-02-11 MED ORDER — ROCURONIUM BROMIDE 10 MG/ML (PF) SYRINGE
PREFILLED_SYRINGE | INTRAVENOUS | Status: AC
  Filled 2021-02-11: qty 10

## 2021-02-11 MED ORDER — PROPOFOL 10 MG/ML IV BOLUS
INTRAVENOUS | Status: DC | PRN
  Administered 2021-02-11: 170 mg via INTRAVENOUS

## 2021-02-11 MED ORDER — DEXAMETHASONE SODIUM PHOSPHATE 4 MG/ML IJ SOLN
INTRAMUSCULAR | Status: DC | PRN
  Administered 2021-02-11: 10 mg via INTRAVENOUS

## 2021-02-11 MED ORDER — FENTANYL CITRATE (PF) 100 MCG/2ML IJ SOLN
INTRAMUSCULAR | Status: AC
  Filled 2021-02-11: qty 2

## 2021-02-11 MED ORDER — FENTANYL CITRATE (PF) 100 MCG/2ML IJ SOLN
INTRAMUSCULAR | Status: DC | PRN
  Administered 2021-02-11: 100 ug via INTRAVENOUS
  Administered 2021-02-11: 50 ug via INTRAVENOUS

## 2021-02-11 MED ORDER — DOCUSATE SODIUM 100 MG PO CAPS: 100.0000 mg | ORAL_CAPSULE | Freq: Two times a day (BID) | ORAL | 0 refills | Status: AC

## 2021-02-11 MED ORDER — CEFAZOLIN SODIUM-DEXTROSE 2-4 GM/100ML-% IV SOLN
2.0000 g | INTRAVENOUS | Status: AC
  Administered 2021-02-11: 2 g via INTRAVENOUS
  Filled 2021-02-11: qty 100

## 2021-02-11 MED ORDER — OXYCODONE HCL 5 MG PO TABS
5.0000 mg | ORAL_TABLET | Freq: Once | ORAL | Status: AC | PRN
Start: 2021-02-11 — End: 2021-02-11
  Administered 2021-02-11: 5 mg via ORAL

## 2021-02-11 MED ORDER — ACETAMINOPHEN 650 MG RE SUPP: 650.0000 mg | RECTAL | Status: DC | PRN

## 2021-02-11 MED ORDER — GABAPENTIN 300 MG PO CAPS
300.0000 mg | ORAL_CAPSULE | ORAL | Status: AC
  Administered 2021-02-11: 300 mg via ORAL
  Filled 2021-02-11: qty 1

## 2021-02-11 MED ORDER — LIDOCAINE 2% (20 MG/ML) 5 ML SYRINGE
INTRAMUSCULAR | Status: AC
  Filled 2021-02-11: qty 10

## 2021-02-11 MED ORDER — OXYCODONE HCL 5 MG PO TABS
ORAL_TABLET | ORAL | Status: AC
  Filled 2021-02-11: qty 1

## 2021-02-11 MED ORDER — ROCURONIUM BROMIDE 10 MG/ML (PF) SYRINGE
PREFILLED_SYRINGE | INTRAVENOUS | Status: DC | PRN
  Administered 2021-02-11: 70 mg via INTRAVENOUS

## 2021-02-11 MED ORDER — EPHEDRINE SULFATE-NACL 50-0.9 MG/10ML-% IV SOSY
PREFILLED_SYRINGE | INTRAVENOUS | Status: DC | PRN
  Administered 2021-02-11: 5 mg via INTRAVENOUS

## 2021-02-11 MED ORDER — LIDOCAINE 2% (20 MG/ML) 5 ML SYRINGE
INTRAMUSCULAR | Status: DC | PRN
  Administered 2021-02-11: 80 mg via INTRAVENOUS

## 2021-02-11 MED ORDER — SODIUM CHLORIDE 0.9% FLUSH: 3.0000 mL | Freq: Two times a day (BID) | INTRAVENOUS | Status: DC

## 2021-02-11 MED ORDER — KETAMINE HCL 10 MG/ML IJ SOLN
INTRAMUSCULAR | Status: AC
  Filled 2021-02-11: qty 1

## 2021-02-11 MED ORDER — CHLORHEXIDINE GLUCONATE 0.12 % MT SOLN
15.0000 mL | Freq: Once | OROMUCOSAL | Status: AC
  Administered 2021-02-11: 15 mL via OROMUCOSAL

## 2021-02-11 MED ORDER — PROPOFOL 500 MG/50ML IV EMUL
INTRAVENOUS | Status: AC
  Filled 2021-02-11: qty 50

## 2021-02-11 MED ORDER — MIDAZOLAM HCL 2 MG/2ML IJ SOLN
INTRAMUSCULAR | Status: AC
  Filled 2021-02-11: qty 2

## 2021-02-11 MED ORDER — LIDOCAINE 2% (20 MG/ML) 5 ML SYRINGE
INTRAMUSCULAR | Status: AC
  Filled 2021-02-11: qty 5

## 2021-02-11 MED ORDER — PHENYLEPHRINE HCL (PRESSORS) 10 MG/ML IV SOLN
INTRAVENOUS | Status: AC
  Filled 2021-02-11: qty 1

## 2021-02-11 MED ORDER — DEXAMETHASONE SODIUM PHOSPHATE 10 MG/ML IJ SOLN
INTRAMUSCULAR | Status: AC
  Filled 2021-02-11: qty 1

## 2021-02-11 MED ORDER — EPHEDRINE 5 MG/ML INJ
INTRAVENOUS | Status: AC
  Filled 2021-02-11: qty 10

## 2021-02-11 MED ORDER — 0.9 % SODIUM CHLORIDE (POUR BTL) OPTIME
TOPICAL | Status: DC | PRN
  Administered 2021-02-11: 1000 mL

## 2021-02-11 MED ORDER — ONDANSETRON HCL 4 MG/2ML IJ SOLN
INTRAMUSCULAR | Status: DC | PRN
  Administered 2021-02-11: 4 mg via INTRAVENOUS

## 2021-02-11 MED ORDER — FENTANYL CITRATE (PF) 100 MCG/2ML IJ SOLN: 25.0000 ug | INTRAMUSCULAR | Status: DC | PRN

## 2021-02-11 MED ORDER — SODIUM CHLORIDE 0.9% FLUSH: 3.0000 mL | INTRAVENOUS | Status: DC | PRN

## 2021-02-11 MED ORDER — MIDAZOLAM HCL 5 MG/5ML IJ SOLN
INTRAMUSCULAR | Status: DC | PRN
  Administered 2021-02-11: 2 mg via INTRAVENOUS

## 2021-02-11 MED ORDER — ACETAMINOPHEN 325 MG PO TABS: 650.0000 mg | ORAL_TABLET | ORAL | Status: DC | PRN

## 2021-02-11 MED ORDER — ORAL CARE MOUTH RINSE: 15.0000 mL | Freq: Once | OROMUCOSAL | Status: AC

## 2021-02-11 MED ORDER — ONDANSETRON HCL 4 MG/2ML IJ SOLN: 4.0000 mg | Freq: Once | INTRAMUSCULAR | Status: DC | PRN

## 2021-02-11 MED ORDER — OXYCODONE HCL 5 MG PO TABS: 5.0000 mg | ORAL_TABLET | ORAL | Status: DC | PRN

## 2021-02-11 MED ORDER — SODIUM CHLORIDE 0.9 % IV SOLN: 250.0000 mL | INTRAVENOUS | Status: DC | PRN

## 2021-02-11 MED ORDER — OXYCODONE HCL 5 MG/5ML PO SOLN
5.0000 mg | Freq: Once | ORAL | Status: AC | PRN
Start: 2021-02-11 — End: 2021-02-11

## 2021-02-11 MED ORDER — BUPIVACAINE LIPOSOME 1.3 % IJ SUSP
INTRAMUSCULAR | Status: DC | PRN
  Administered 2021-02-11: 20 mL

## 2021-02-11 MED ORDER — CHLORHEXIDINE GLUCONATE 4 % EX LIQD: 60.0000 mL | Freq: Once | CUTANEOUS | Status: DC

## 2021-02-11 MED ORDER — BUPIVACAINE-EPINEPHRINE 0.25% -1:200000 IJ SOLN
INTRAMUSCULAR | Status: DC | PRN
  Administered 2021-02-11: 30 mL

## 2021-02-11 MED ORDER — OXYCODONE HCL 5 MG PO TABS: 5.0000 mg | ORAL_TABLET | Freq: Three times a day (TID) | ORAL | 0 refills | Status: AC | PRN

## 2021-02-11 MED ORDER — BUPIVACAINE-EPINEPHRINE (PF) 0.25% -1:200000 IJ SOLN
INTRAMUSCULAR | Status: AC
  Filled 2021-02-11: qty 30

## 2021-02-11 MED ORDER — ACETAMINOPHEN 500 MG PO TABS
1000.0000 mg | ORAL_TABLET | ORAL | Status: AC
  Administered 2021-02-11: 1000 mg via ORAL
  Filled 2021-02-11: qty 2

## 2021-02-11 MED ORDER — SUGAMMADEX SODIUM 200 MG/2ML IV SOLN
INTRAVENOUS | Status: DC | PRN
  Administered 2021-02-11: 200 mg via INTRAVENOUS

## 2021-02-11 MED ORDER — PHENYLEPHRINE 40 MCG/ML (10ML) SYRINGE FOR IV PUSH (FOR BLOOD PRESSURE SUPPORT)
PREFILLED_SYRINGE | INTRAVENOUS | Status: AC
  Filled 2021-02-11: qty 10

## 2021-02-11 MED ORDER — ONDANSETRON HCL 4 MG/2ML IJ SOLN
INTRAMUSCULAR | Status: AC
  Filled 2021-02-11: qty 2

## 2021-02-11 MED ORDER — LACTATED RINGERS IV SOLN: INTRAVENOUS | Status: DC

## 2021-02-11 SURGICAL SUPPLY — 39 items
ADH SKN CLS APL DERMABOND .7 (GAUZE/BANDAGES/DRESSINGS) ×1
APL PRP STRL LF DISP 70% ISPRP (MISCELLANEOUS) ×1
BINDER ABDOMINAL 12 ML 46-62 (SOFTGOODS) ×1 IMPLANT
BNDG ADH 1X3 SHEER STRL LF (GAUZE/BANDAGES/DRESSINGS) ×1 IMPLANT
BNDG ADH THN 3X1 STRL LF (GAUZE/BANDAGES/DRESSINGS) ×1
CABLE HIGH FREQUENCY MONO STRZ (ELECTRODE) ×2 IMPLANT
CHLORAPREP W/TINT 26 (MISCELLANEOUS) ×2 IMPLANT
COVER SURGICAL LIGHT HANDLE (MISCELLANEOUS) ×2 IMPLANT
COVER WAND RF STERILE (DRAPES) IMPLANT
DECANTER SPIKE VIAL GLASS SM (MISCELLANEOUS) IMPLANT
DERMABOND ADVANCED (GAUZE/BANDAGES/DRESSINGS) ×1
DERMABOND ADVANCED .7 DNX12 (GAUZE/BANDAGES/DRESSINGS) ×1 IMPLANT
DEVICE SECURE STRAP 25 ABSORB (INSTRUMENTS) ×2 IMPLANT
ELECT REM PT RETURN 15FT ADLT (MISCELLANEOUS) ×2 IMPLANT
GLOVE SURG ENC MOIS LTX SZ6 (GLOVE) ×2 IMPLANT
GLOVE SURG UNDER LTX SZ6.5 (GLOVE) ×2 IMPLANT
GOWN STRL REUS W/TWL LRG LVL3 (GOWN DISPOSABLE) ×2 IMPLANT
GOWN STRL REUS W/TWL XL LVL3 (GOWN DISPOSABLE) ×4 IMPLANT
GRASPER SUT TROCAR 14GX15 (MISCELLANEOUS) ×2 IMPLANT
KIT BASIN OR (CUSTOM PROCEDURE TRAY) ×2 IMPLANT
KIT TURNOVER KIT A (KITS) ×2 IMPLANT
MARKER SKIN DUAL TIP RULER LAB (MISCELLANEOUS) ×2 IMPLANT
MESH VENTRALIGHT ST 4X6IN (Mesh General) ×1 IMPLANT
PENCIL SMOKE EVACUATOR (MISCELLANEOUS) IMPLANT
SCISSORS LAP 5X35 DISP (ENDOMECHANICALS) ×2 IMPLANT
SET IRRIG TUBING LAPAROSCOPIC (IRRIGATION / IRRIGATOR) IMPLANT
SET TUBE SMOKE EVAC HIGH FLOW (TUBING) ×2 IMPLANT
SHEARS HARMONIC ACE PLUS 36CM (ENDOMECHANICALS) IMPLANT
SLEEVE XCEL OPT CAN 5 100 (ENDOMECHANICALS) ×2 IMPLANT
SPONGE GAUZE 2X2 8PLY STRL LF (GAUZE/BANDAGES/DRESSINGS) ×1 IMPLANT
STRIP CLOSURE SKIN 1/2X4 (GAUZE/BANDAGES/DRESSINGS) ×2 IMPLANT
SUT ETHIBOND 0 (SUTURE) ×4 IMPLANT
SUT MNCRL AB 4-0 PS2 18 (SUTURE) ×2 IMPLANT
SUT PDS AB 1 CT1 27 (SUTURE) ×5 IMPLANT
TAPE CLOTH SURG 4X10 WHT LF (GAUZE/BANDAGES/DRESSINGS) ×1 IMPLANT
TOWEL OR 17X26 10 PK STRL BLUE (TOWEL DISPOSABLE) ×2 IMPLANT
TRAY LAPAROSCOPIC (CUSTOM PROCEDURE TRAY) ×2 IMPLANT
TROCAR BLADELESS OPT 5 100 (ENDOMECHANICALS) ×2 IMPLANT
TROCAR XCEL 12X100 BLDLESS (ENDOMECHANICALS) ×2 IMPLANT

## 2021-02-11 NOTE — Op Note (Signed)
Operative Note  Bonnie Ramos  [id number]  [id number]  02/11/2021   Surgeon: Leeroy Bock A ConnorMD  Procedure performed: Laparoscopic repair of incarcerated recurrent incisional hernia and incarcerated supraumbilical hernia with mesh  Preop diagnosis: Incarcerated ventral and incisional hernias Post-op diagnosis/intraop findings: Subcentimeter supraumbilical fascial defect containing incarcerated preperitoneal fat, 1 cm infraumbilical fascial defect containing large volume of incarcerated omentum  Specimens: None Retained items: None EBL: Minimal cc Complications: none  Description of procedure: After obtaining informed consent the patient was taken to the operating room and placed supine on operating room table wheregeneral endotracheal anesthesia was initiated, preoperative antibiotics were administered, SCDs applied, and a formal timeout was performed.  The abdomen was prepped and draped in the usual sterile fashion.  Peritoneal access was gained using an optical entry in the left upper quadrant and insufflation to 15 mmHg ensued without incident.  There was no injury from our entry.  2 incarcerated hernias identified.  After infiltration with local (Exparel mixed with quarter percent Marcaine and epinephrine), 2 additional left-sided 5 mm trochars were placed.  Cautery was used to take the falciform ligament off the abdominal wall several centimeters and then to divide adhesions of the incarcerated fat in the supraumbilical hernia until this could be reduced.  This defect was less than a centimeter in diameter.  We then turned to the infraumbilical defect, adhesion surrounding this were lysed with cautery and sharp dissection.  This was found to contain omentum.  Once we had freed all adhesions, a small infraumbilical incision was made and the hernia sac bluntly dissected from the surrounding soft tissues and excised.  There was a large volume of incarcerated omentum, the majority of which  was amputated with cautery ensuring hemostasis.  The remainder was reduced into the abdomen.  The fascial defect was cleared off and this measured about 1 cm in diameter.  A 15 cm x 10 cm Ventralex mesh was selected and the rough side marked for orientation.  #1 PDS sutures were placed in the 4 cardinal directions and then this was rolled up and inserted through the fascial defect into the abdomen.  The fascia was closed with interrupted #1 PDS and the abdomen was reinsufflated.  The mesh was then unfurled and the previously placed sutures were brought through the abdominal wall using a laparoscopic suture passer under direct visualization to secure the mesh to the anterior abdominal wall, ensuring correct orientation and that the right side is towards the abdominal wall with the coated side towards the viscera.  The secure strap tacker was then used to further secure the mesh circumferentially and several inner crown tacks were also placed.  On completion the mesh is flush against the abdominal wall with no exposed rough surface.  The abdomen was surveyed once more and hemostasis confirmed.  The omentum was brought back down over the bowel.  The abdomen was then desufflated and all skin incisions closed with subcuticular 4-0 Monocryl.  The dead space in the infraumbilical region was closed down with 3-0 Vicryls before closing the skin.  Benzoin, Steri-Strips and Band-Aids/sterile dressings were then applied followed by an abdominal binder.  The patient was then awakened, extubated and taken to PACU in stable condition.   All counts were correct at the completion of the case.

## 2021-02-11 NOTE — Anesthesia Procedure Notes (Signed)
Procedure Name: Intubation Date/Time: 02/11/2021 7:32 AM Performed by: Vanessa [place], CRNA Pre-anesthesia Checklist: Patient identified, Emergency Drugs available, Suction available and Patient being monitored Patient Re-evaluated:Patient Re-evaluated prior to induction Oxygen Delivery Method: Circle system utilized Preoxygenation: Pre-oxygenation with 100% oxygen Induction Type: IV induction Ventilation: Mask ventilation without difficulty and Oral airway inserted - appropriate to patient size Laryngoscope Size: 2 and Miller Grade View: Grade I Tube type: Oral Tube size: 7.0 mm Number of attempts: 1 Airway Equipment and Method: Stylet Placement Confirmation: ETT inserted through vocal cords under direct vision,  positive ETCO2 and breath sounds checked- equal and bilateral Secured at: 22 cm Tube secured with: Tape Dental Injury: Teeth and Oropharynx as per pre-operative assessment

## 2021-02-11 NOTE — Anesthesia Postprocedure Evaluation (Signed)
Anesthesia Post Note  Patient: Bonnie Ramos  Procedure(s) Performed: LAPAROSCOPIC INCISIONAL HERNIA REPAIR WITH MESH (N/A Abdomen)     Patient location during evaluation: PACU Anesthesia Type: General Level of consciousness: awake and alert Pain management: pain level controlled Vital Signs Assessment: post-procedure vital signs reviewed and stable Respiratory status: spontaneous breathing, nonlabored ventilation and respiratory function stable Cardiovascular status: blood pressure returned to baseline and stable Postop Assessment: no apparent nausea or vomiting Anesthetic complications: no   No complications documented.  Last Vitals:  Vitals:   02/11/21 1038 02/11/21 1045  BP: (!) 148/75 (!) 148/75  Pulse:  72  Resp:    Temp: 36.9 C 36.9 C  SpO2: 98% 97%    Last Pain:  Vitals:   02/11/21 1045  TempSrc:   PainSc: 0-No pain                 Beryle Lathe

## 2021-02-11 NOTE — Transfer of Care (Signed)
Immediate Anesthesia Transfer of Care Note  Patient: Bonnie Ramos  Procedure(s) Performed: LAPAROSCOPIC INCISIONAL HERNIA REPAIR WITH MESH (N/A Abdomen)  Patient Location: PACU  Anesthesia Type:General  Level of Consciousness: awake, alert , oriented and patient cooperative  Airway & Oxygen Therapy: Patient Spontanous Breathing and Patient connected to face mask  Post-op Assessment: Report given to RN and Post -op Vital signs reviewed and stable  Post vital signs: Reviewed and stable  Last Vitals:  Vitals Value Taken Time  BP 139/63 02/11/21 0918  Temp 36.7 C 02/11/21 0918  Pulse 77 02/11/21 0921  Resp 12 02/11/21 0921  SpO2 98 % 02/11/21 0921  Vitals shown include unvalidated device data.  Last Pain:  Vitals:   02/11/21 0626  TempSrc:   PainSc: 0-No pain         Complications: No complications documented.

## 2021-02-11 NOTE — Discharge Instructions (Signed)
HERNIA REPAIR: POST OP INSTRUCTIONS   EAT Gradually transition to a high fiber diet with a fiber supplement over the next few weeks after discharge.  Start with a pureed / full liquid diet (see below)  WALK Walk an hour a day (cumulative- not all at once).  Control your pain to do that.    CONTROL PAIN Control pain so that you can walk, sleep, tolerate sneezing/coughing, and go up/down stairs.  HAVE A BOWEL MOVEMENT DAILY Keep your bowels regular to avoid problems.  OK to try a laxative to override constipation.  OK to use an antidairrheal to slow down diarrhea.  Call if not better after 2 tries  CALL IF YOU HAVE PROBLEMS/CONCERNS Call if you are still struggling despite following these instructions. Call if you have concerns not answered by these instructions  ######################################################################    1. DIET: Follow a light bland diet & liquids the first 24 hours after arrival home, such as soup, liquids, starches, etc.  Be sure to drink plenty of fluids.  Quickly advance to a usual solid diet within a few days.  Avoid fast food or heavy meals as your are more likely to get nauseated or have irregular bowels.  A low-sugar, high-fiber diet for the rest of your life is ideal.   2. Take your usually prescribed home medications unless otherwise directed.  3. PAIN CONTROL: a. Pain is best controlled by a usual combination of three different methods TOGETHER: i. Ice/Heat ii. Over the counter pain medication iii. Prescription pain medication b. Most patients will experience some swelling and bruising around the hernia(s) such as the bellybutton, groins, or old incisions.  Ice packs or heating pads (30-60 minutes up to 6 times a day) will help. Use ice for the first few days to help decrease swelling and bruising, then switch to heat to help relax tight/sore spots and speed recovery.  Some people prefer to use ice alone, heat alone, alternating between ice &  heat.  Experiment to what works for you.  Swelling and bruising can take several weeks to resolve.   c. It is helpful to take an over-the-counter pain medication regularly for the first days: i. Naproxen (Aleve, etc)  Two 220mg tabs twice a day OR Ibuprofen (Advil, etc) Three 200mg tabs four times a day (every meal & bedtime) AND ii. Acetaminophen (Tylenol, etc) 325-650mg four times a day (every meal & bedtime) d. A  prescription for pain medication should be given to you upon discharge.  Take your pain medication as prescribed, IF NEEDED.  i. If you are having problems/concerns with the prescription medicine (does not control pain, nausea, vomiting, rash, itching, etc), please call us [phone] to see if we need to switch you to a different pain medicine that will work better for you and/or control your side effect better. ii. If you need a refill on your pain medication, please contact your pharmacy.  They will contact our office to request authorization. Prescriptions will not be filled after 5 pm or on week-ends.  4. Avoid getting constipated.  Between the surgery and the pain medications, it is common to experience some constipation.  Increasing fluid intake and taking a fiber supplement (such as Metamucil, Citrucel, FiberCon, MiraLax, etc) 1-2 times a day regularly will usually help prevent this problem from occurring.  A mild laxative (prune juice, Milk of Magnesia, MiraLax, etc) should be taken according to package directions if there are no bowel movements after 48 hours.    5.   Wash / shower every day, starting 2 days after surgery.  You may shower over the steri strips which are waterproof.    6. Remove your outer bandage 2 days after surgery. Steri strips will peel off after 1-2 weeks.  You may leave the incision open to air.  You may replace a dressing/Band-Aid to cover an incision for comfort if you wish.  Continue to shower over incision(s) after the dressing is off.  7. ACTIVITIES  as tolerated:   a. You may resume regular (light) daily activities beginning the next day--such as daily self-care, walking, climbing stairs--gradually increasing activities as tolerated.  Control your pain so that you can walk an hour a day.  If you can walk 30 minutes without difficulty, it is safe to try more intense activity such as jogging, treadmill, bicycling, low-impact aerobics, swimming, etc. b. Refrain from the most intensive and strenuous activity such as sit-ups, heavy lifting, contact sports, etc  Refrain from any heavy lifting or straining until 6 weeks after surgery.   c. DO NOT PUSH THROUGH PAIN.  Let pain be your guide: If it hurts to do something, don't do it.  Pain is your body warning you to avoid that activity for another week until the pain goes down. d. Wear abdominal binder at all times for the first week after surgery. After this, wear when up and moving around for the few weeks- this will help reduce pain e. You may drive when you are no longer taking prescription pain medication, you can comfortably wear a seatbelt, and you can safely maneuver your car and apply brakes. f. You may have sexual intercourse when it is comfortable.   8. FOLLOW UP in our office a. Please call [facility] at [phone] to set up an appointment to see your surgeon in the office for a follow-up appointment approximately 2-3 weeks after your surgery. b. Make sure that you call for this appointment the day you arrive home to insure a convenient appointment time.  9.  If you have disability of FMLA / Family leave forms, please bring the forms to the office for processing.  (do not give to your surgeon).  WHEN TO CALL us [phone]: 1. Poor pain control 2. Reactions / problems with new medications (rash/itching, nausea, etc)  3. Fever over 101.5 F (38.5 C) 4. Inability to urinate 5. Nausea and/or vomiting 6. Worsening swelling or bruising 7. Continued bleeding from incision. 8. Increased pain,  redness, or drainage from the incision   The clinic staff is available to answer your questions during regular business hours (8:30am-5pm).  Please don't hesitate to call and ask to speak to one of our nurses for clinical concerns.   If you have a medical emergency, go to the nearest emergency room or call 911.  A surgeon from [facility] is always on call at the hospitals in [facility], [place] [address], [address], [place], [place]  [address] ?  [address] [address], [place], [place]   [address] MAIN: [phone] ? TOLL FREE: [phone] ? FAX: [phone] [email or web address]

## 2021-02-11 NOTE — Interval H&P Note (Signed)
History and Physical Interval Note:  02/11/2021 6:59 AM  Bonnie Ramos  has presented today for surgery, with the diagnosis of RECURRENT INCISIONAL HERNIA.  The various methods of treatment have been discussed with the patient and family. After consideration of risks, benefits and other options for treatment, the patient has consented to  Procedure(s): LAPAROSCOPIC INCISIONAL HERNIA REPAIR WITH MESH (N/A) as a surgical intervention.  The patient's history has been reviewed, patient examined, no change in status, stable for surgery.  I have reviewed the patient's chart and labs.  Questions were answered to the patient's satisfaction.     Dennis Hegeman Lollie Sails

## 2021-02-14 ENCOUNTER — Encounter (HOSPITAL_COMMUNITY): Payer: Self-pay | Admitting: Surgery

## 2021-04-06 DIAGNOSIS — I10 Essential (primary) hypertension: Secondary | ICD-10-CM | POA: Diagnosis not present

## 2021-04-06 DIAGNOSIS — E7800 Pure hypercholesterolemia, unspecified: Secondary | ICD-10-CM | POA: Diagnosis not present

## 2021-04-06 DIAGNOSIS — Z0000 Encounter for general adult medical examination without abnormal findings: Secondary | ICD-10-CM | POA: Diagnosis not present

## 2021-04-06 DIAGNOSIS — F324 Major depressive disorder, single episode, in partial remission: Secondary | ICD-10-CM | POA: Diagnosis not present

## 2021-04-06 DIAGNOSIS — E119 Type 2 diabetes mellitus without complications: Secondary | ICD-10-CM | POA: Diagnosis not present

## 2021-07-13 DIAGNOSIS — G4730 Sleep apnea, unspecified: Secondary | ICD-10-CM | POA: Diagnosis not present

## 2021-09-29 DIAGNOSIS — E1165 Type 2 diabetes mellitus with hyperglycemia: Secondary | ICD-10-CM | POA: Diagnosis not present

## 2021-09-29 DIAGNOSIS — M797 Fibromyalgia: Secondary | ICD-10-CM | POA: Diagnosis not present

## 2021-09-29 DIAGNOSIS — E7800 Pure hypercholesterolemia, unspecified: Secondary | ICD-10-CM | POA: Diagnosis not present

## 2021-09-29 DIAGNOSIS — I10 Essential (primary) hypertension: Secondary | ICD-10-CM | POA: Diagnosis not present

## 2021-09-29 DIAGNOSIS — Z23 Encounter for immunization: Secondary | ICD-10-CM | POA: Diagnosis not present

## 2021-09-29 DIAGNOSIS — F324 Major depressive disorder, single episode, in partial remission: Secondary | ICD-10-CM | POA: Diagnosis not present

## 2022-01-09 DIAGNOSIS — H11002 Unspecified pterygium of left eye: Secondary | ICD-10-CM | POA: Diagnosis not present

## 2022-02-24 DIAGNOSIS — Z0100 Encounter for examination of eyes and vision without abnormal findings: Secondary | ICD-10-CM | POA: Diagnosis not present

## 2022-05-03 ENCOUNTER — Other Ambulatory Visit: Payer: Self-pay | Admitting: Family Medicine

## 2022-05-03 DIAGNOSIS — M8580 Other specified disorders of bone density and structure, unspecified site: Secondary | ICD-10-CM

## 2022-05-03 DIAGNOSIS — Z23 Encounter for immunization: Secondary | ICD-10-CM | POA: Diagnosis not present

## 2022-05-03 DIAGNOSIS — Z0000 Encounter for general adult medical examination without abnormal findings: Secondary | ICD-10-CM | POA: Diagnosis not present

## 2022-05-03 DIAGNOSIS — M85851 Other specified disorders of bone density and structure, right thigh: Secondary | ICD-10-CM | POA: Diagnosis not present

## 2022-05-03 DIAGNOSIS — I10 Essential (primary) hypertension: Secondary | ICD-10-CM | POA: Diagnosis not present

## 2022-05-03 DIAGNOSIS — G4730 Sleep apnea, unspecified: Secondary | ICD-10-CM | POA: Diagnosis not present

## 2022-05-03 DIAGNOSIS — E7800 Pure hypercholesterolemia, unspecified: Secondary | ICD-10-CM | POA: Diagnosis not present

## 2022-05-03 DIAGNOSIS — F324 Major depressive disorder, single episode, in partial remission: Secondary | ICD-10-CM | POA: Diagnosis not present

## 2022-05-03 DIAGNOSIS — M797 Fibromyalgia: Secondary | ICD-10-CM | POA: Diagnosis not present

## 2022-05-03 DIAGNOSIS — E559 Vitamin D deficiency, unspecified: Secondary | ICD-10-CM | POA: Diagnosis not present

## 2022-05-03 DIAGNOSIS — E1169 Type 2 diabetes mellitus with other specified complication: Secondary | ICD-10-CM | POA: Diagnosis not present

## 2022-05-23 ENCOUNTER — Other Ambulatory Visit: Payer: Self-pay | Admitting: Family Medicine

## 2022-05-23 DIAGNOSIS — Z1231 Encounter for screening mammogram for malignant neoplasm of breast: Secondary | ICD-10-CM

## 2022-08-10 IMAGING — CR DG CERVICAL SPINE 2 OR 3 VIEWS
4 series · 4 of 4 positions shown · non-contrast
Comparison: None.

CLINICAL DATA: Neck pain

EXAM:
CERVICAL SPINE - 2-3 VIEW

[w c-spine lat * (1 of 2)]
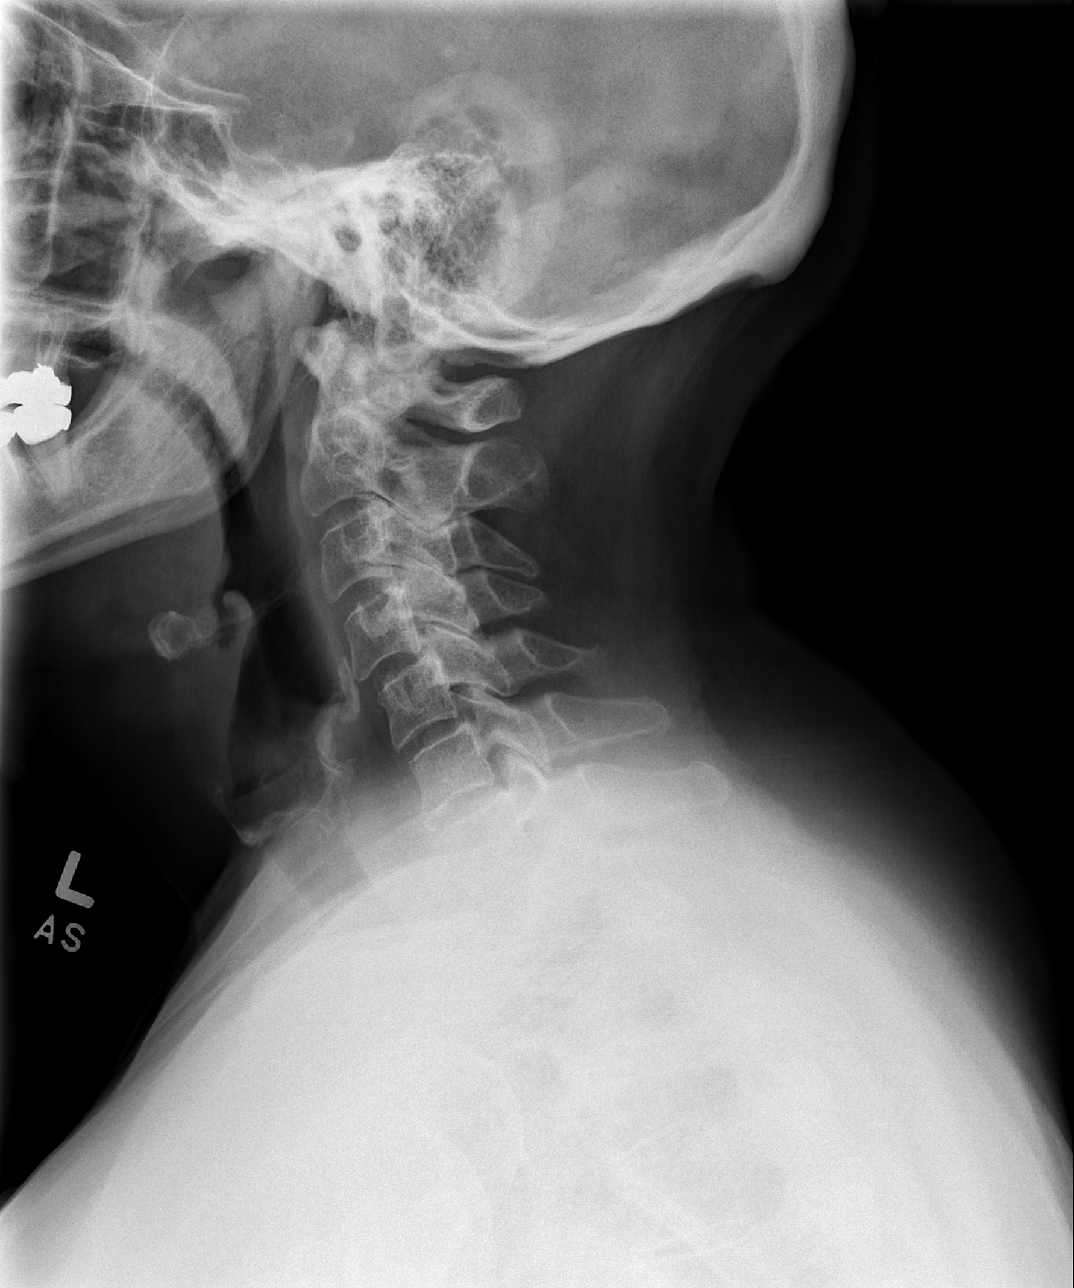

[w c-spine lat * (2 of 2)]
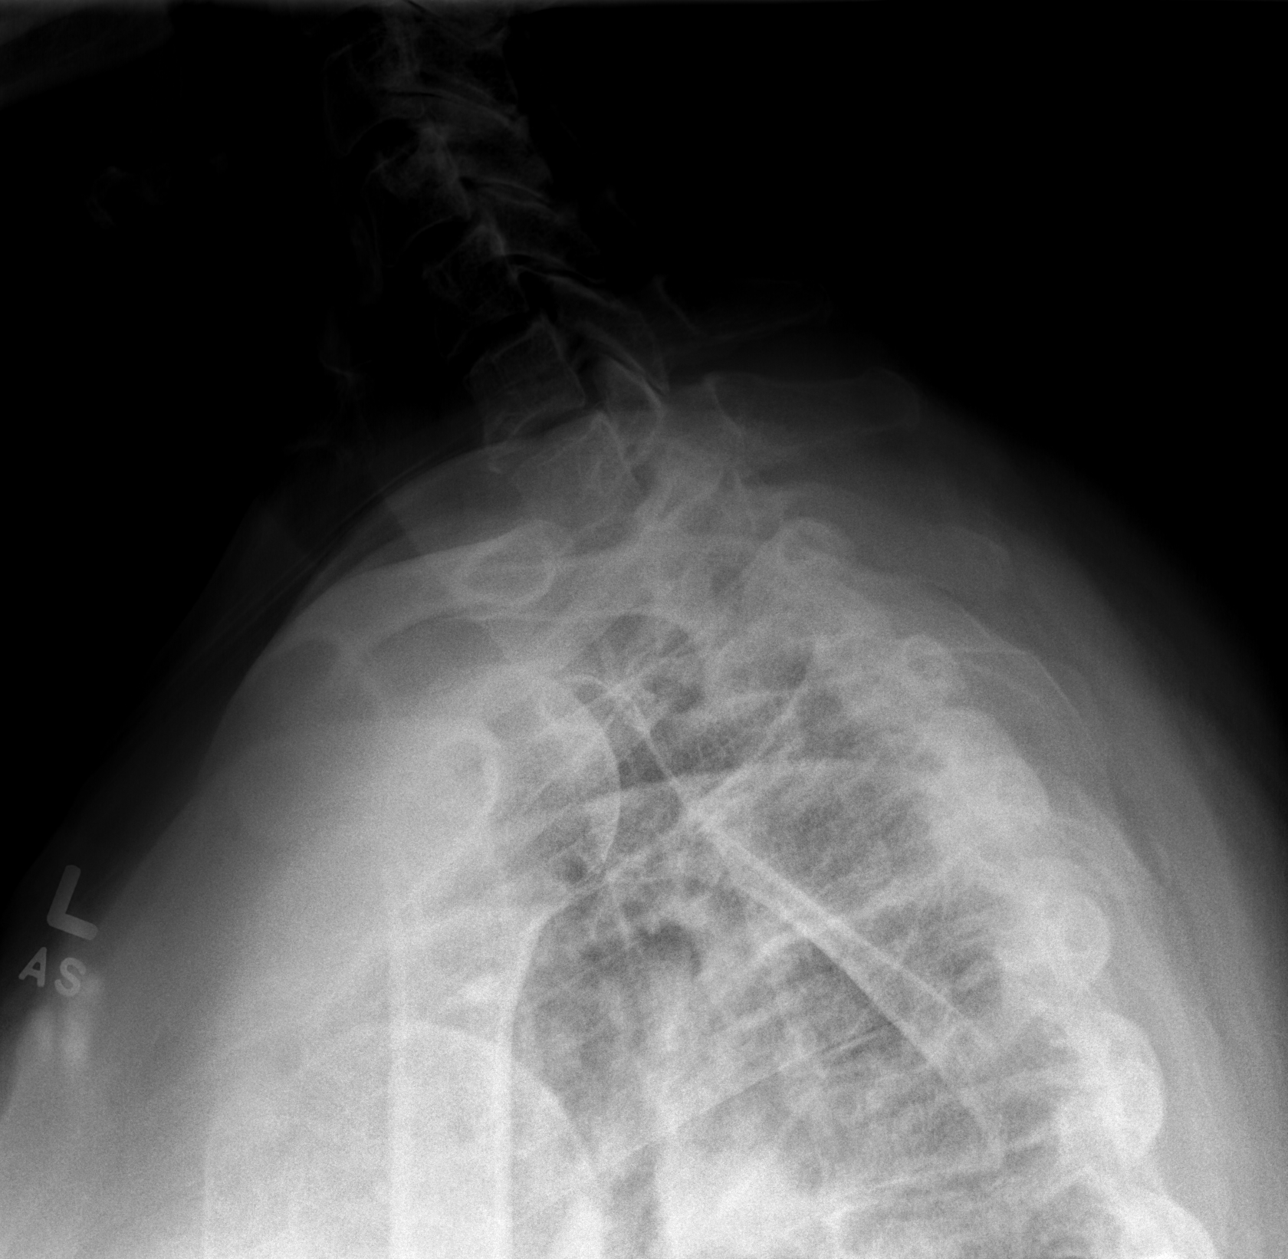

[w c-spine a.p. *]
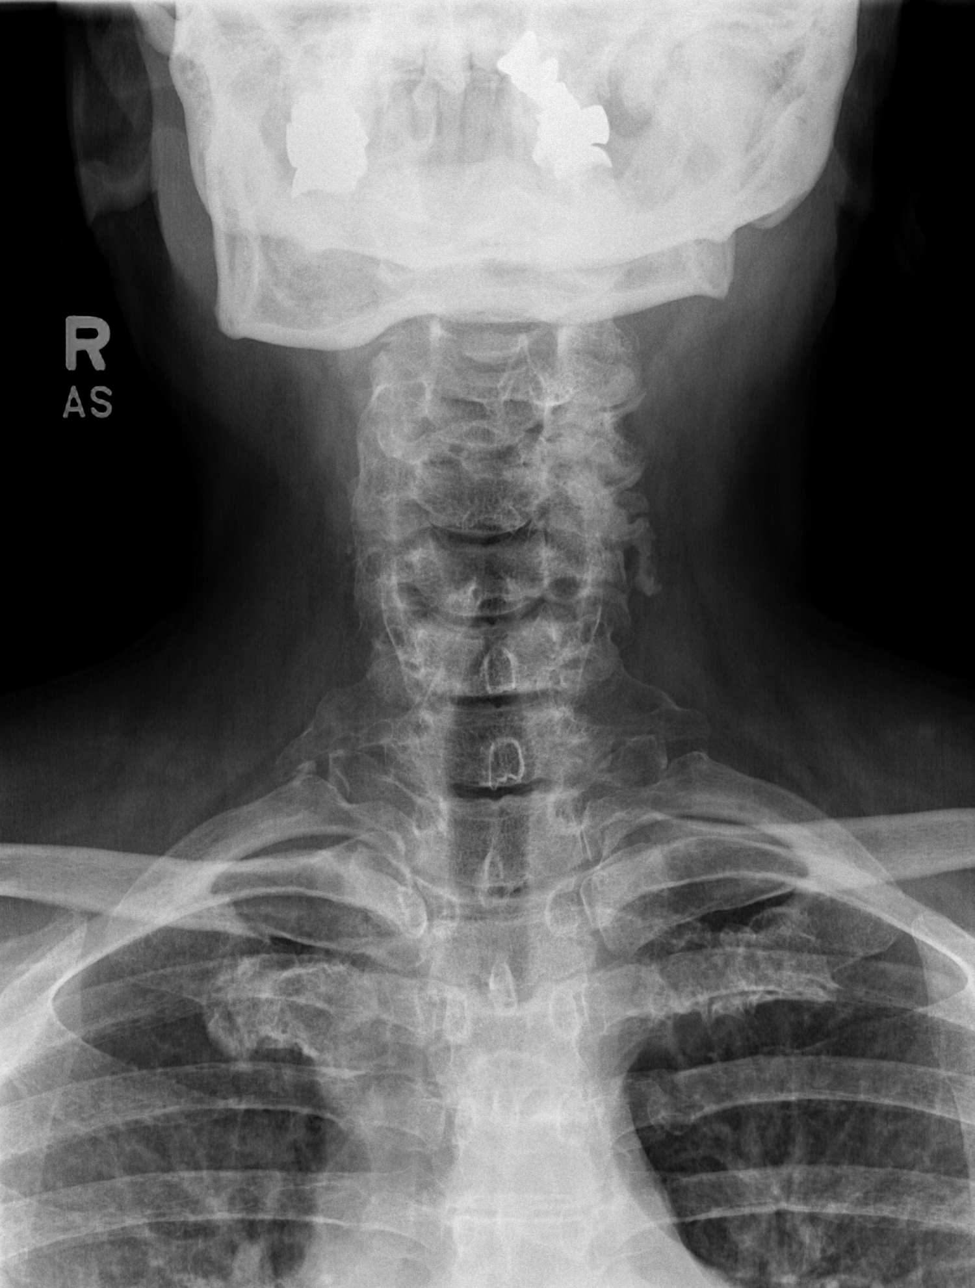

[w c-spine odontoid *]
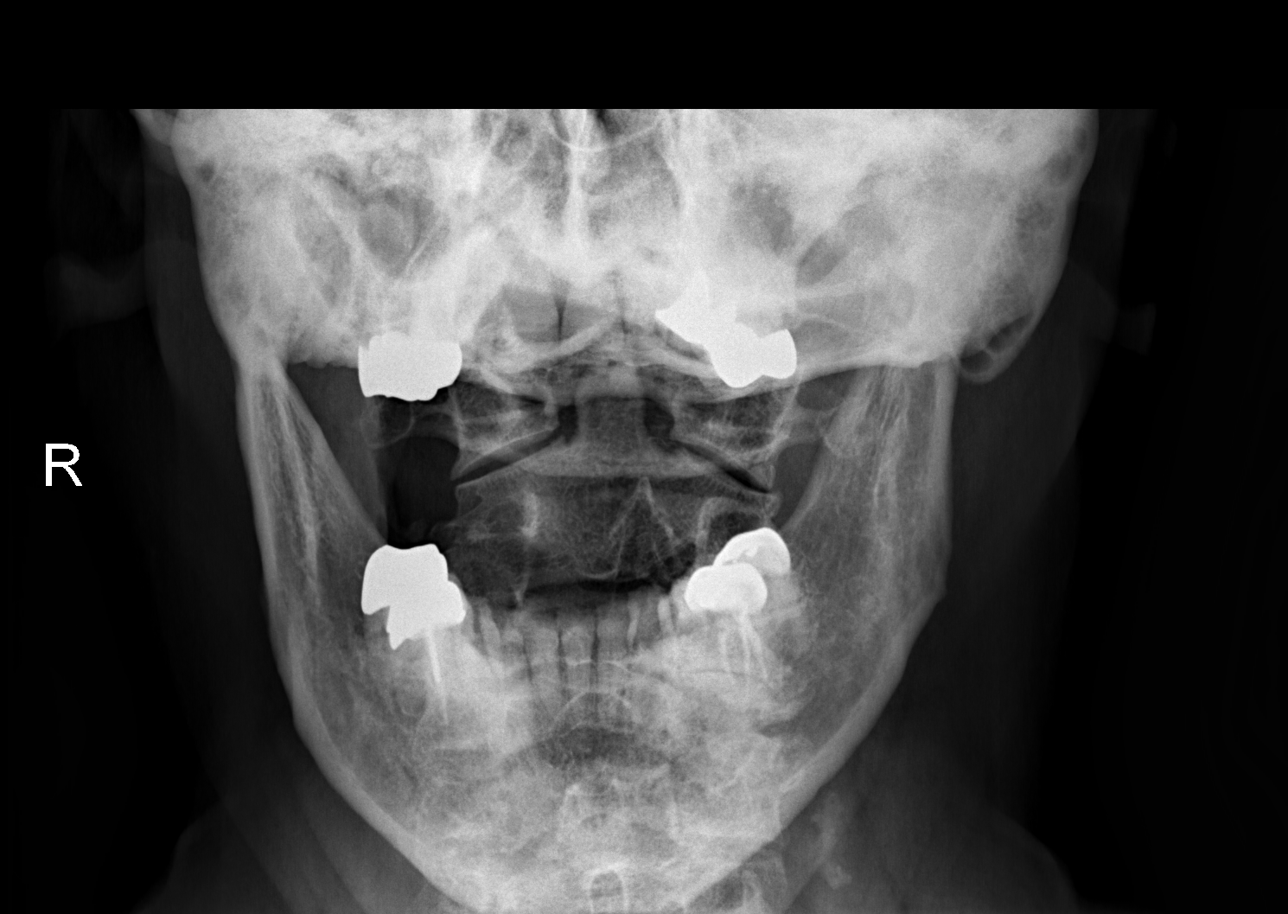

[4 of 4 positions shown; findings below may reference images not displayed]

FINDINGS: Suboptimal visualization of cervicothoracic junction. Trace
anterolisthesis C3 on C4 and C4 on C5. Vertebral body heights are
maintained. Mild carotid vascular calcification. Dens and lateral
masses are within normal limits. Facet degenerative changes.
IMPRESSION: Trace anterolisthesis C3 on C4 and C4 on C5, likely degenerative.

## 2022-11-01 ENCOUNTER — Ambulatory Visit
Admission: RE | Admit: 2022-11-01 | Discharge: 2022-11-01 | Disposition: A | Discharge status: Home / Self Care | Payer: No Typology Code available for payment source | Source: Ambulatory Visit | Attending: Family Medicine | Admitting: Family Medicine

## 2022-11-01 DIAGNOSIS — M8580 Other specified disorders of bone density and structure, unspecified site: Secondary | ICD-10-CM

## 2022-11-01 DIAGNOSIS — M85851 Other specified disorders of bone density and structure, right thigh: Secondary | ICD-10-CM | POA: Diagnosis not present

## 2022-11-01 DIAGNOSIS — Z780 Asymptomatic menopausal state: Secondary | ICD-10-CM | POA: Diagnosis not present

## 2022-11-01 DIAGNOSIS — Z1231 Encounter for screening mammogram for malignant neoplasm of breast: Secondary | ICD-10-CM | POA: Diagnosis not present

## 2022-11-15 DIAGNOSIS — E1165 Type 2 diabetes mellitus with hyperglycemia: Secondary | ICD-10-CM | POA: Diagnosis not present

## 2022-11-15 DIAGNOSIS — G4730 Sleep apnea, unspecified: Secondary | ICD-10-CM | POA: Diagnosis not present

## 2022-11-15 DIAGNOSIS — I10 Essential (primary) hypertension: Secondary | ICD-10-CM | POA: Diagnosis not present

## 2022-11-15 DIAGNOSIS — F324 Major depressive disorder, single episode, in partial remission: Secondary | ICD-10-CM | POA: Diagnosis not present

## 2022-11-15 DIAGNOSIS — M85851 Other specified disorders of bone density and structure, right thigh: Secondary | ICD-10-CM | POA: Diagnosis not present

## 2022-11-15 DIAGNOSIS — E559 Vitamin D deficiency, unspecified: Secondary | ICD-10-CM | POA: Diagnosis not present

## 2022-11-15 DIAGNOSIS — M797 Fibromyalgia: Secondary | ICD-10-CM | POA: Diagnosis not present

## 2022-11-15 DIAGNOSIS — E7800 Pure hypercholesterolemia, unspecified: Secondary | ICD-10-CM | POA: Diagnosis not present

## 2023-01-10 DIAGNOSIS — Z9889 Other specified postprocedural states: Secondary | ICD-10-CM | POA: Diagnosis not present

## 2023-01-10 DIAGNOSIS — E119 Type 2 diabetes mellitus without complications: Secondary | ICD-10-CM | POA: Diagnosis not present

## 2023-03-05 DIAGNOSIS — Z0100 Encounter for examination of eyes and vision without abnormal findings: Secondary | ICD-10-CM | POA: Diagnosis not present

## 2023-03-06 DIAGNOSIS — M25561 Pain in right knee: Secondary | ICD-10-CM | POA: Diagnosis not present

## 2023-03-06 DIAGNOSIS — M25562 Pain in left knee: Secondary | ICD-10-CM | POA: Diagnosis not present

## 2023-05-16 DIAGNOSIS — E119 Type 2 diabetes mellitus without complications: Secondary | ICD-10-CM | POA: Diagnosis not present

## 2023-05-16 DIAGNOSIS — M797 Fibromyalgia: Secondary | ICD-10-CM | POA: Diagnosis not present

## 2023-05-16 DIAGNOSIS — I10 Essential (primary) hypertension: Secondary | ICD-10-CM | POA: Diagnosis not present

## 2023-05-16 DIAGNOSIS — F324 Major depressive disorder, single episode, in partial remission: Secondary | ICD-10-CM | POA: Diagnosis not present

## 2023-05-16 DIAGNOSIS — E559 Vitamin D deficiency, unspecified: Secondary | ICD-10-CM | POA: Diagnosis not present

## 2023-05-16 DIAGNOSIS — E7800 Pure hypercholesterolemia, unspecified: Secondary | ICD-10-CM | POA: Diagnosis not present

## 2023-05-16 DIAGNOSIS — Z0000 Encounter for general adult medical examination without abnormal findings: Secondary | ICD-10-CM | POA: Diagnosis not present

## 2023-05-16 DIAGNOSIS — G4730 Sleep apnea, unspecified: Secondary | ICD-10-CM | POA: Diagnosis not present

## 2023-05-16 DIAGNOSIS — Z9989 Dependence on other enabling machines and devices: Secondary | ICD-10-CM | POA: Diagnosis not present

## 2023-08-14 DIAGNOSIS — M25561 Pain in right knee: Secondary | ICD-10-CM | POA: Diagnosis not present

## 2023-08-14 DIAGNOSIS — M25562 Pain in left knee: Secondary | ICD-10-CM | POA: Diagnosis not present

## 2023-09-18 ENCOUNTER — Other Ambulatory Visit: Payer: Self-pay | Admitting: Family Medicine

## 2023-09-18 DIAGNOSIS — Z1231 Encounter for screening mammogram for malignant neoplasm of breast: Secondary | ICD-10-CM

## 2023-10-11 DIAGNOSIS — M25562 Pain in left knee: Secondary | ICD-10-CM | POA: Diagnosis not present

## 2023-10-11 DIAGNOSIS — M25561 Pain in right knee: Secondary | ICD-10-CM | POA: Diagnosis not present

## 2023-11-05 ENCOUNTER — Ambulatory Visit
Admission: RE | Admit: 2023-11-05 | Discharge: 2023-11-05 | Disposition: A | Discharge status: Home / Self Care | Payer: No Typology Code available for payment source | Source: Ambulatory Visit | Attending: Family Medicine | Admitting: Family Medicine

## 2023-11-05 DIAGNOSIS — Z1231 Encounter for screening mammogram for malignant neoplasm of breast: Secondary | ICD-10-CM

## 2023-11-20 DIAGNOSIS — Z9989 Dependence on other enabling machines and devices: Secondary | ICD-10-CM | POA: Diagnosis not present

## 2023-11-20 DIAGNOSIS — Z23 Encounter for immunization: Secondary | ICD-10-CM | POA: Diagnosis not present

## 2023-11-20 DIAGNOSIS — G4730 Sleep apnea, unspecified: Secondary | ICD-10-CM | POA: Diagnosis not present

## 2023-11-20 DIAGNOSIS — E7800 Pure hypercholesterolemia, unspecified: Secondary | ICD-10-CM | POA: Diagnosis not present

## 2023-11-20 DIAGNOSIS — F324 Major depressive disorder, single episode, in partial remission: Secondary | ICD-10-CM | POA: Diagnosis not present

## 2023-11-20 DIAGNOSIS — I10 Essential (primary) hypertension: Secondary | ICD-10-CM | POA: Diagnosis not present

## 2023-11-20 DIAGNOSIS — M797 Fibromyalgia: Secondary | ICD-10-CM | POA: Diagnosis not present

## 2023-11-20 DIAGNOSIS — E119 Type 2 diabetes mellitus without complications: Secondary | ICD-10-CM | POA: Diagnosis not present

## 2023-11-20 DIAGNOSIS — E559 Vitamin D deficiency, unspecified: Secondary | ICD-10-CM | POA: Diagnosis not present

## 2023-11-20 DIAGNOSIS — I700 Atherosclerosis of aorta: Secondary | ICD-10-CM | POA: Diagnosis not present

## 2024-02-05 DIAGNOSIS — G4733 Obstructive sleep apnea (adult) (pediatric): Secondary | ICD-10-CM | POA: Diagnosis not present

## 2024-03-04 DIAGNOSIS — G4733 Obstructive sleep apnea (adult) (pediatric): Secondary | ICD-10-CM | POA: Diagnosis not present

## 2024-04-04 DIAGNOSIS — G4733 Obstructive sleep apnea (adult) (pediatric): Secondary | ICD-10-CM | POA: Diagnosis not present

## 2024-04-23 DIAGNOSIS — G4733 Obstructive sleep apnea (adult) (pediatric): Secondary | ICD-10-CM | POA: Diagnosis not present

## 2024-04-23 DIAGNOSIS — M170 Bilateral primary osteoarthritis of knee: Secondary | ICD-10-CM | POA: Diagnosis not present

## 2024-05-04 DIAGNOSIS — G4733 Obstructive sleep apnea (adult) (pediatric): Secondary | ICD-10-CM | POA: Diagnosis not present

## 2024-05-21 ENCOUNTER — Other Ambulatory Visit (HOSPITAL_BASED_OUTPATIENT_CLINIC_OR_DEPARTMENT_OTHER): Payer: Self-pay | Admitting: Family Medicine

## 2024-05-21 DIAGNOSIS — Z0000 Encounter for general adult medical examination without abnormal findings: Secondary | ICD-10-CM | POA: Diagnosis not present

## 2024-05-21 DIAGNOSIS — I10 Essential (primary) hypertension: Secondary | ICD-10-CM | POA: Diagnosis not present

## 2024-05-21 DIAGNOSIS — Z136 Encounter for screening for cardiovascular disorders: Secondary | ICD-10-CM | POA: Diagnosis not present

## 2024-05-21 DIAGNOSIS — E559 Vitamin D deficiency, unspecified: Secondary | ICD-10-CM | POA: Diagnosis not present

## 2024-05-21 DIAGNOSIS — E7800 Pure hypercholesterolemia, unspecified: Secondary | ICD-10-CM | POA: Diagnosis not present

## 2024-05-21 DIAGNOSIS — E119 Type 2 diabetes mellitus without complications: Secondary | ICD-10-CM | POA: Diagnosis not present

## 2024-05-21 DIAGNOSIS — M797 Fibromyalgia: Secondary | ICD-10-CM | POA: Diagnosis not present

## 2024-05-21 DIAGNOSIS — M85851 Other specified disorders of bone density and structure, right thigh: Secondary | ICD-10-CM | POA: Diagnosis not present

## 2024-05-21 DIAGNOSIS — G4730 Sleep apnea, unspecified: Secondary | ICD-10-CM | POA: Diagnosis not present

## 2024-05-21 DIAGNOSIS — F324 Major depressive disorder, single episode, in partial remission: Secondary | ICD-10-CM | POA: Diagnosis not present

## 2024-06-04 DIAGNOSIS — G4733 Obstructive sleep apnea (adult) (pediatric): Secondary | ICD-10-CM | POA: Diagnosis not present

## 2024-06-12 ENCOUNTER — Ambulatory Visit (HOSPITAL_BASED_OUTPATIENT_CLINIC_OR_DEPARTMENT_OTHER)
Admission: RE | Admit: 2024-06-12 | Discharge: 2024-06-12 | Disposition: A | Discharge status: Home / Self Care | Payer: Self-pay | Source: Ambulatory Visit | Attending: Family Medicine | Admitting: Family Medicine

## 2024-06-12 DIAGNOSIS — Z136 Encounter for screening for cardiovascular disorders: Secondary | ICD-10-CM | POA: Insufficient documentation

## 2024-07-04 DIAGNOSIS — G4733 Obstructive sleep apnea (adult) (pediatric): Secondary | ICD-10-CM | POA: Diagnosis not present

## 2024-08-04 DIAGNOSIS — G4733 Obstructive sleep apnea (adult) (pediatric): Secondary | ICD-10-CM | POA: Diagnosis not present

## 2024-08-09 ENCOUNTER — Other Ambulatory Visit: Payer: Self-pay | Admitting: Medical Genetics

## 2024-08-21 ENCOUNTER — Other Ambulatory Visit

## 2024-08-21 DIAGNOSIS — Z006 Encounter for examination for normal comparison and control in clinical research program: Secondary | ICD-10-CM

## 2024-08-29 LAB — GENECONNECT MOLECULAR SCREEN: Genetic Analysis Overall Interpretation: NEGATIVE

## 2024-09-02 DIAGNOSIS — G4733 Obstructive sleep apnea (adult) (pediatric): Secondary | ICD-10-CM | POA: Diagnosis not present

## 2024-09-04 DIAGNOSIS — G4733 Obstructive sleep apnea (adult) (pediatric): Secondary | ICD-10-CM | POA: Diagnosis not present

## 2024-09-24 ENCOUNTER — Other Ambulatory Visit: Payer: Self-pay | Admitting: Family Medicine

## 2024-09-24 DIAGNOSIS — Z1231 Encounter for screening mammogram for malignant neoplasm of breast: Secondary | ICD-10-CM

## 2024-09-25 DIAGNOSIS — L02212 Cutaneous abscess of back [any part, except buttock]: Secondary | ICD-10-CM | POA: Diagnosis not present

## 2024-09-25 DIAGNOSIS — B027 Disseminated zoster: Secondary | ICD-10-CM | POA: Diagnosis not present

## 2024-10-01 DIAGNOSIS — B029 Zoster without complications: Secondary | ICD-10-CM | POA: Diagnosis not present

## 2024-10-01 DIAGNOSIS — L0291 Cutaneous abscess, unspecified: Secondary | ICD-10-CM | POA: Diagnosis not present

## 2024-10-06 ENCOUNTER — Encounter (HOSPITAL_BASED_OUTPATIENT_CLINIC_OR_DEPARTMENT_OTHER): Payer: Self-pay

## 2024-10-08 ENCOUNTER — Encounter (HOSPITAL_BASED_OUTPATIENT_CLINIC_OR_DEPARTMENT_OTHER): Payer: Self-pay | Admitting: Cardiology

## 2024-10-08 ENCOUNTER — Ambulatory Visit (HOSPITAL_BASED_OUTPATIENT_CLINIC_OR_DEPARTMENT_OTHER): Admitting: Cardiology

## 2024-10-08 VITALS — BP 136/72 | HR 76 | Ht 64.0 in | Wt 199.1 lb

## 2024-10-08 DIAGNOSIS — E7800 Pure hypercholesterolemia, unspecified: Secondary | ICD-10-CM | POA: Diagnosis not present

## 2024-10-08 DIAGNOSIS — I10 Essential (primary) hypertension: Secondary | ICD-10-CM | POA: Diagnosis not present

## 2024-10-08 DIAGNOSIS — Z7689 Persons encountering health services in other specified circumstances: Secondary | ICD-10-CM | POA: Diagnosis not present

## 2024-10-08 DIAGNOSIS — I2510 Atherosclerotic heart disease of native coronary artery without angina pectoris: Secondary | ICD-10-CM

## 2024-10-08 DIAGNOSIS — Z7189 Other specified counseling: Secondary | ICD-10-CM | POA: Diagnosis not present

## 2024-10-08 DIAGNOSIS — Z712 Person consulting for explanation of examination or test findings: Secondary | ICD-10-CM | POA: Diagnosis not present

## 2024-10-08 NOTE — Patient Instructions (Signed)

## 2024-10-08 NOTE — Progress Notes (Signed)
 Cardiology Office Note:  .   Date:  10/08/2024  ID:  ROWEN HUR, DOB 09/11/1956, MRN [id number] PCP: Arloa Elsie SAUNDERS, MD  [facility] Providers Cardiologist:  Shelda Bruckner, MD {  History of Present Illness: .   Bonnie Ramos is a 68 y.o. female with PMH type II diabetes, hypertension, hyperlipidemia, and sleep apnea. She is referred for new patient consultation for elevated coronary calcium score at the request of Dr. Arloa.   Referral from 07/03/24 reviewed. Note from visit 05/21/24 with Dr. Arloa reviewed. Ordered for calcium score at that visit. Noted to be on pravastatin, mounjaro, amlodipine at that time. Lipids done at that visit show Tchol 178, HDL 83, LDL 84, TG 60. Ca score 06/12/24 was 148 (81st %ile).  Today: Here to review her calcium score today. No other specific cardiac complaints. Has been on pravastatin for about 12-18 months. Has tried atorvastatin, rosuvastatin in the past, had muscle aches. Has been on aspirin for about 10 years, takes 4 days/week otherwise she bruises a lot. Has been on amlodipine for about 2 years, has not been on anything else. Has taken hydrochlorothiazide prn for ankle swelling in the past, none in a long time.   She is adopted, has found some half siblings through ancestry site. Mother died around age 1 of a stroke. Father passed in his late 54s, unclear history.  Has lost a lot of weight, peak 267 lbs, down to 199 lbs. Has had a lot of success with mounjaro, eating healthy, doing weight watchers. Diabetes has been well controlled. No scheduled exercise but doesn't sit still much.   Checks blood pressure at home. Has log today, reports largely 150s/70s in the morning, before her amlodipine. This is a wrist cuff.   ROS: Denies chest pain, shortness of breath at rest or with normal exertion. No PND, orthopnea, significant LE edema or unexpected weight gain. No syncope or palpitations. ROS otherwise negative except as noted.    Studies Reviewed: SABRA    EKG:  EKG Interpretation Date/Time:  Wednesday October 08 2024 14:54:44 EDT Ventricular Rate:  77 PR Interval:  178 QRS Duration:  70 QT Interval:  384 QTC Calculation: 434 R Axis:   18  Text Interpretation: Normal sinus rhythm Normal ECG When compared with ECG of 02-Feb-2021 11:25, No significant change was found Confirmed by Bruckner Shelda [phone]) on 10/08/2024 3:35:27 PM    Physical Exam:   VS:  BP 136/72   Pulse 76   Ht 5' 4 (1.626 m)   Wt 199 lb 1.6 oz (90.3 kg)   SpO2 95%   BMI 34.18 kg/m    Wt Readings from Last 3 Encounters:  10/08/24 199 lb 1.6 oz (90.3 kg)  02/11/21 214 lb 12.8 oz (97.4 kg)  03/10/15 250 lb (113.4 kg)    GEN: Well nourished, well developed in no acute distress HEENT: Normal, moist mucous membranes NECK: No JVD CARDIAC: regular rhythm, normal S1 and S2, no rubs or gallops. No murmur. VASCULAR: Radial and DP pulses 2+ bilaterally. No carotid bruits RESPIRATORY:  Clear to auscultation without rales, wheezing or rhonchi  ABDOMEN: Soft, non-tender, non-distended MUSCULOSKELETAL:  Ambulates independently SKIN: Warm and dry, no edema NEUROLOGIC:  Alert and oriented x 3. No focal neuro deficits noted. PSYCHIATRIC:  Normal affect    ASSESSMENT AND PLAN: .    Elevated coronary calcium score, consistent with nonobstructive CAD Hypercholesterolemia Statin intolerance (to atorvastatin and rosuvastatin) -We reviewed the calcium score at length, including images  as well as the graph showing mortality based on calcium score. We discussed the pathophysiology of cholesterol plaque formation, the role of calcium and why it is a marker, how plaque is key to acute MI/CVA, and how known plaque is managed with medications.   -she was already on pravastatin at the time of her calcium score. Discussed LDL goal <70. Was 57 in June.  -discussed options, including focusing on lifestyle, increasing pravastatin dose, or adding zetia. After  shared decision making, will focus on continued lifestyle change and discussed medications if not at goal -she is on aspirin 81 mg four times/week -reviewed red flag warning signs that need immediate medical attention   Hypertension -home numbers higher at home than here. Does use wrist cuff, but has arm cuff at home. Discussed that wrist cuff typically runs higher than home. She will check on arm cuff -continue amlodipine for now -expect BP to continue to improve as weight loss, lifestyle improves  Type II diabetes Obesity -on mounjaro  CV risk counseling and prevention -recommend heart healthy/Mediterranean diet, with whole grains, fruits, vegetable, fish, lean meats, nuts, and olive oil. Limit salt. -recommend moderate walking, 3-5 times/week for 30-50 minutes each session. Aim for at least 150 minutes/week. Goal should be pace of 3 miles/hours, or walking 1.5 miles in 30 minutes -recommend avoidance of tobacco products. Avoid excess alcohol.   Dispo: 1 year or sooner as needed  Signed, Shelda Bruckner, MD   Shelda Bruckner, MD, PhD, [facility]  [facility]  [place]  [facility] at [facility] [address] [place], [place] [address] [phone]

## 2024-11-05 ENCOUNTER — Ambulatory Visit
Admission: RE | Admit: 2024-11-05 | Discharge: 2024-11-05 | Disposition: A | Discharge status: Home / Self Care | Source: Ambulatory Visit | Attending: Family Medicine | Admitting: Family Medicine

## 2024-11-05 DIAGNOSIS — Z1231 Encounter for screening mammogram for malignant neoplasm of breast: Secondary | ICD-10-CM | POA: Diagnosis not present

## 2024-11-20 DIAGNOSIS — E119 Type 2 diabetes mellitus without complications: Secondary | ICD-10-CM | POA: Diagnosis not present

## 2024-12-23 NOTE — Progress Notes (Signed)
 Bonnie Ramos                                          MRN: [id number]   12/23/2024   The [facility] Quality Team Specialist reviewed this patient medical record for the purposes of chart review for care gap closure. The following were reviewed: chart review for care gap closure-kidney health evaluation for diabetes:eGFR  and uACR.    [facility] Quality Team
